# Patient Record
Sex: Female | Born: 1955 | Race: White | Hispanic: No | Marital: Married | Smoking: Never smoker
Health system: Southern US, Community
[De-identification: ages and names within clinical notes are randomized; demographics above are authoritative.]

## PROBLEM LIST (undated history)

## (undated) DIAGNOSIS — R112 Nausea with vomiting, unspecified: Secondary | ICD-10-CM

## (undated) DIAGNOSIS — T4145XA Adverse effect of unspecified anesthetic, initial encounter: Secondary | ICD-10-CM

## (undated) DIAGNOSIS — Z9889 Other specified postprocedural states: Secondary | ICD-10-CM

## (undated) DIAGNOSIS — T8859XA Other complications of anesthesia, initial encounter: Secondary | ICD-10-CM

## (undated) HISTORY — PX: HIP ARTHROPLASTY: SHX981

---

## 1998-12-02 ENCOUNTER — Other Ambulatory Visit: Admission: RE | Admit: 1998-12-02 | Discharge: 1998-12-02 | Payer: Self-pay | Admitting: Obstetrics and Gynecology

## 1999-02-11 ENCOUNTER — Encounter (INDEPENDENT_AMBULATORY_CARE_PROVIDER_SITE_OTHER): Payer: Self-pay | Admitting: Specialist

## 1999-02-11 ENCOUNTER — Ambulatory Visit (HOSPITAL_BASED_OUTPATIENT_CLINIC_OR_DEPARTMENT_OTHER): Admission: RE | Admit: 1999-02-11 | Discharge: 1999-02-11 | Payer: Self-pay | Admitting: Plastic Surgery

## 1999-12-05 ENCOUNTER — Other Ambulatory Visit: Admission: RE | Admit: 1999-12-05 | Discharge: 1999-12-05 | Payer: Self-pay | Admitting: Obstetrics and Gynecology

## 1999-12-15 ENCOUNTER — Encounter: Admission: RE | Admit: 1999-12-15 | Discharge: 1999-12-15 | Payer: Self-pay | Admitting: Obstetrics and Gynecology

## 1999-12-15 ENCOUNTER — Encounter: Payer: Self-pay | Admitting: Obstetrics and Gynecology

## 2000-12-07 ENCOUNTER — Other Ambulatory Visit: Admission: RE | Admit: 2000-12-07 | Discharge: 2000-12-07 | Payer: Self-pay | Admitting: Obstetrics and Gynecology

## 2000-12-17 ENCOUNTER — Encounter: Payer: Self-pay | Admitting: Obstetrics and Gynecology

## 2000-12-17 ENCOUNTER — Encounter: Admission: RE | Admit: 2000-12-17 | Discharge: 2000-12-17 | Payer: Self-pay | Admitting: Obstetrics and Gynecology

## 2001-12-22 ENCOUNTER — Encounter: Payer: Self-pay | Admitting: Obstetrics and Gynecology

## 2001-12-22 ENCOUNTER — Encounter: Admission: RE | Admit: 2001-12-22 | Discharge: 2001-12-22 | Payer: Self-pay | Admitting: Obstetrics and Gynecology

## 2003-01-09 ENCOUNTER — Encounter: Admission: RE | Admit: 2003-01-09 | Discharge: 2003-01-09 | Payer: Self-pay | Admitting: Obstetrics and Gynecology

## 2003-11-19 ENCOUNTER — Encounter: Admission: RE | Admit: 2003-11-19 | Discharge: 2003-11-19 | Payer: Self-pay | Admitting: Family Medicine

## 2004-01-11 ENCOUNTER — Encounter: Admission: RE | Admit: 2004-01-11 | Discharge: 2004-01-11 | Payer: Self-pay | Admitting: Family Medicine

## 2004-02-12 ENCOUNTER — Encounter: Admission: RE | Admit: 2004-02-12 | Discharge: 2004-02-12 | Payer: Self-pay | Admitting: Family Medicine

## 2004-08-13 ENCOUNTER — Ambulatory Visit (HOSPITAL_COMMUNITY): Admission: RE | Admit: 2004-08-13 | Discharge: 2004-08-13 | Payer: Self-pay | Admitting: Family Medicine

## 2005-01-22 ENCOUNTER — Encounter: Admission: RE | Admit: 2005-01-22 | Discharge: 2005-01-22 | Payer: Self-pay | Admitting: Family Medicine

## 2005-02-18 ENCOUNTER — Encounter: Admission: RE | Admit: 2005-02-18 | Discharge: 2005-02-18 | Payer: Self-pay | Admitting: Family Medicine

## 2005-02-25 ENCOUNTER — Encounter: Admission: RE | Admit: 2005-02-25 | Discharge: 2005-02-25 | Payer: Self-pay | Admitting: Family Medicine

## 2005-03-12 ENCOUNTER — Encounter: Admission: RE | Admit: 2005-03-12 | Discharge: 2005-03-12 | Payer: Self-pay | Admitting: Family Medicine

## 2005-04-21 ENCOUNTER — Encounter: Admission: RE | Admit: 2005-04-21 | Discharge: 2005-04-21 | Payer: Self-pay

## 2005-09-08 ENCOUNTER — Emergency Department (HOSPITAL_COMMUNITY): Admission: EM | Admit: 2005-09-08 | Discharge: 2005-09-08 | Payer: Self-pay | Admitting: Emergency Medicine

## 2005-09-09 ENCOUNTER — Ambulatory Visit (HOSPITAL_COMMUNITY): Admission: RE | Admit: 2005-09-09 | Discharge: 2005-09-09 | Payer: Self-pay | Admitting: Emergency Medicine

## 2005-09-09 ENCOUNTER — Encounter: Payer: Self-pay | Admitting: Vascular Surgery

## 2005-12-25 ENCOUNTER — Emergency Department (HOSPITAL_COMMUNITY): Admission: EM | Admit: 2005-12-25 | Discharge: 2005-12-25 | Payer: Self-pay | Admitting: Family Medicine

## 2006-01-25 ENCOUNTER — Encounter: Admission: RE | Admit: 2006-01-25 | Discharge: 2006-01-25 | Payer: Self-pay | Admitting: Family Medicine

## 2006-02-16 ENCOUNTER — Other Ambulatory Visit: Admission: RE | Admit: 2006-02-16 | Discharge: 2006-02-16 | Payer: Self-pay | Admitting: Family Medicine

## 2006-10-22 IMAGING — US US EXTREM LOW VENOUS*R*
1 series · 13 of 17 positions shown · non-contrast
Comparison: none

<!--  IDXRADR:ADDEND:BEGIN -->Addendum Begins<!--  IDXRADR:ADDEND:INNER_BEGIN -->CORRECTION ? 03/25/05:
 The patient was scheduled as a self-referral.

 <!--  IDXRADR:ADDEND:INNER_END -->Addendum Ends
<!--  IDXRADR:ADDEND:END -->Clinical Data:  The patient returns today two weeks following foam sclerotherapy of varicose veins bilaterally.  The patient is complaining of a fairly constant dull achy pain within her right calf, and foot, with intermittent sharp pains laterally in her calf and foot. 
 DUPLICATE COPY for exam association in [facility] -  No change from original report.
 RIGHT LOWER EXTREMITY VENOUS ULTRASOUND:
 OUTPATIENT CONSULTATION ? [id number]:
TECHNIQUE: Gray-scale sonography with compression, as well as color and duplex Doppler ultrasound, were performed to evaluate the deep venous system from the level of the common femoral vein through the popliteal and proximal calf veins.
 On physical exam, the varicose veins that were treated in the right calf appear thrombosed and are mildly tender.  There is no swelling or erythema.  No palpable areas of tenderness otherwise noted in the right calf.  
 The patient underwent right lower extremity venous ultrasound.  This shows no evidence of deep venous thrombosis with normal phasicity, augmentation, and compression.  The varicose veins that were injected in the anterior right calf are thrombosed and correspond to the palpable area of tenderness on the anterior lower calf. 
 I have instructed the patient to continue to wear her compression hose as well as to take Ibuprofen 800 mg three times a day for the next seven to ten days.  I will check back with the patient in one week to assess improvement.

[Series 1: unknown · 13 of 17 slices shown]
[im 1/17]
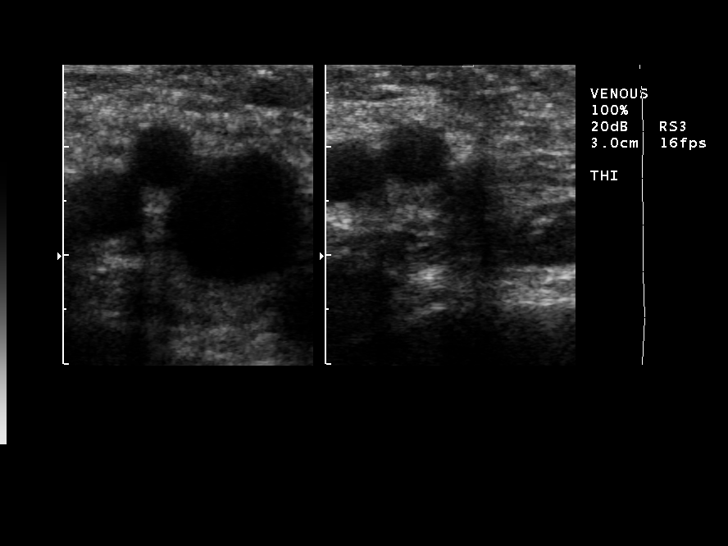
[im 2/17]
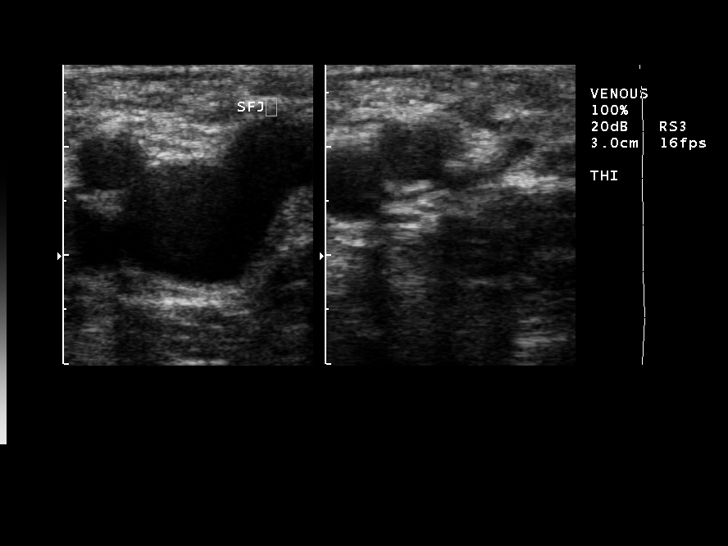
[im 4/17]
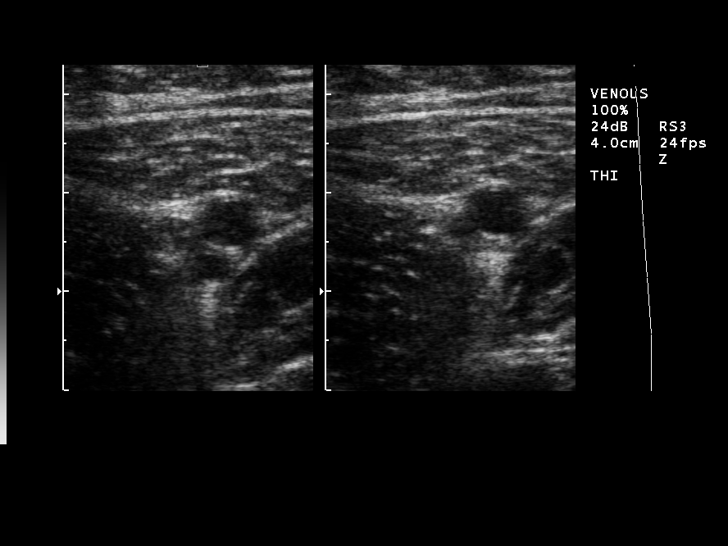
[im 5/17]
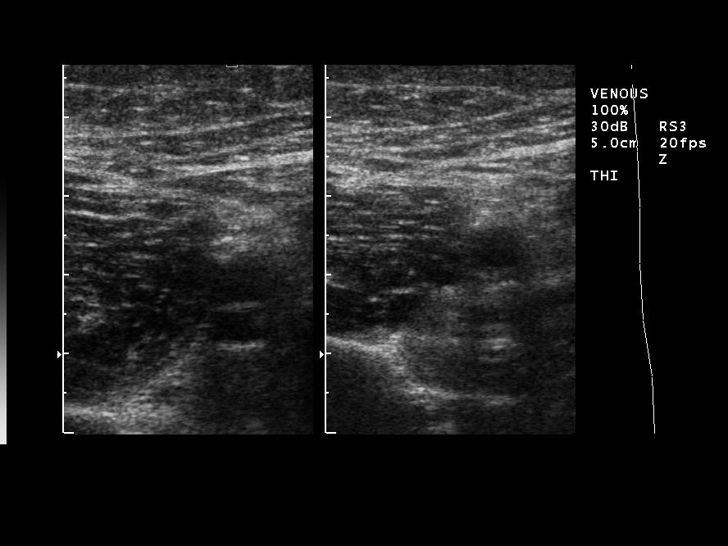
[im 6/17]
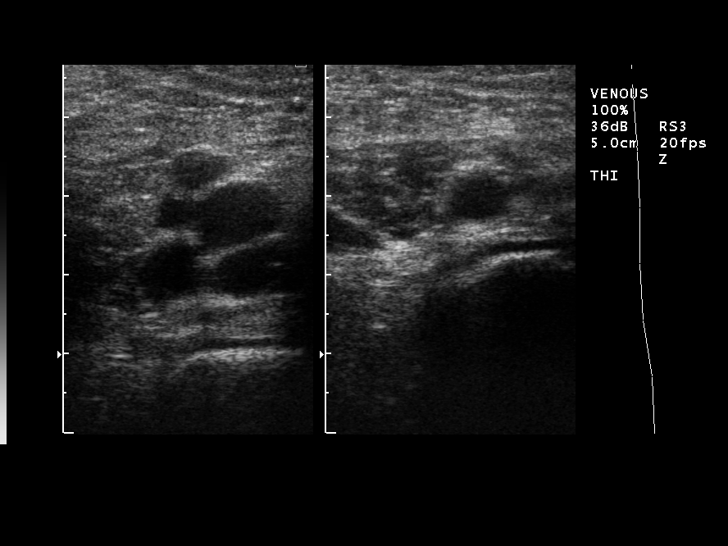
[im 8/17]
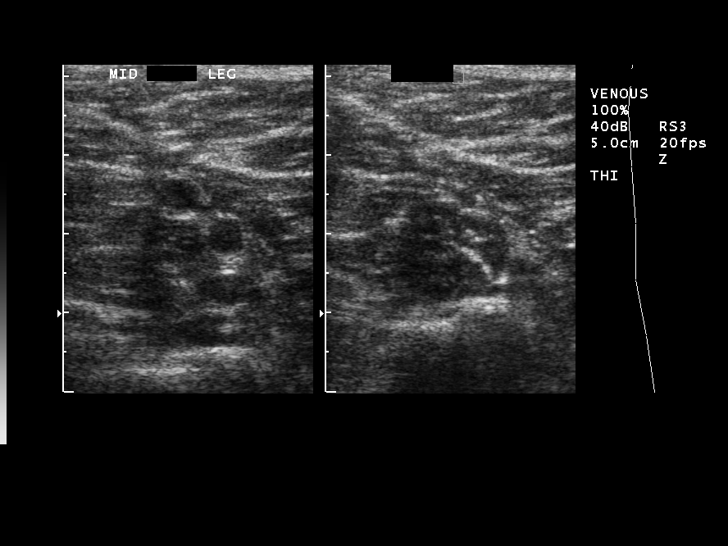
[im 9/17]
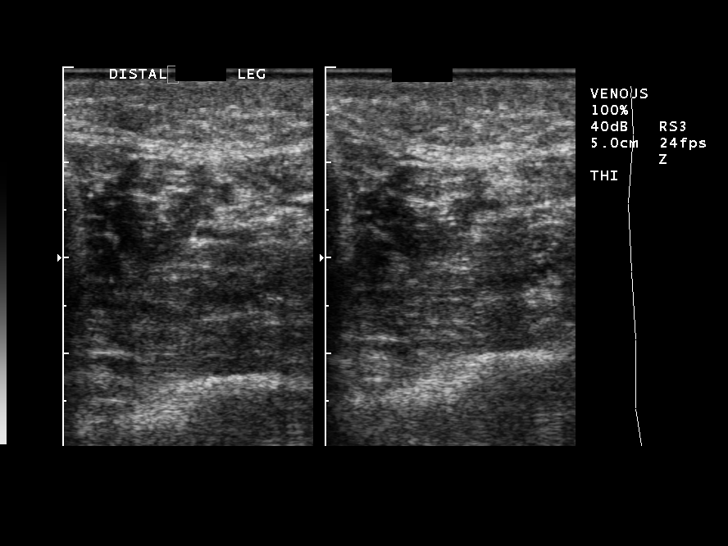
[im 10/17]
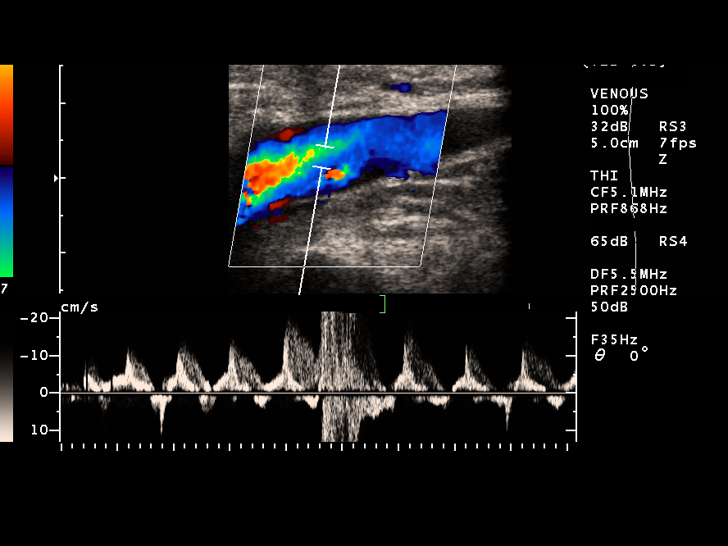
[im 12/17]
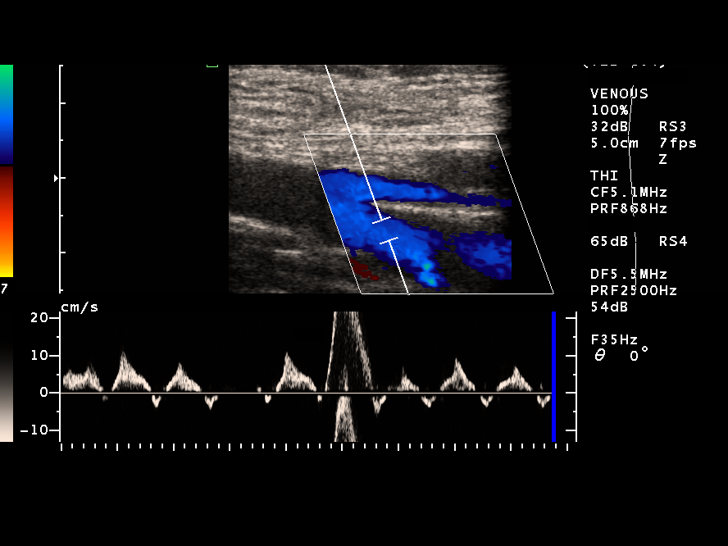
[im 13/17]
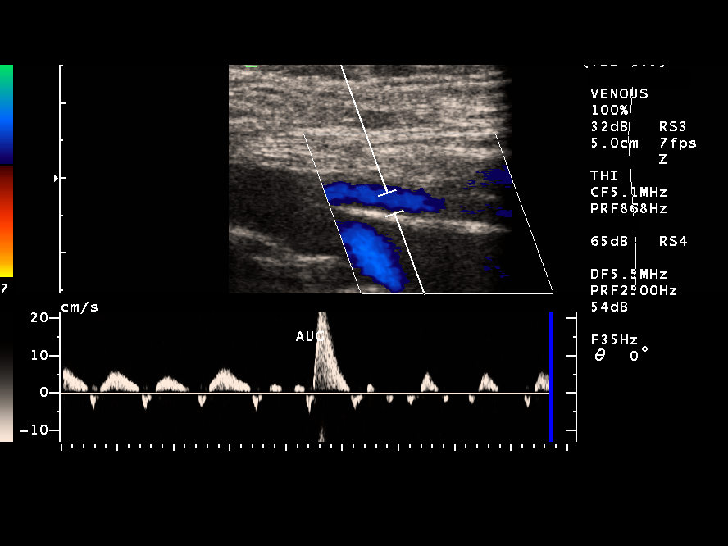
[im 14/17]
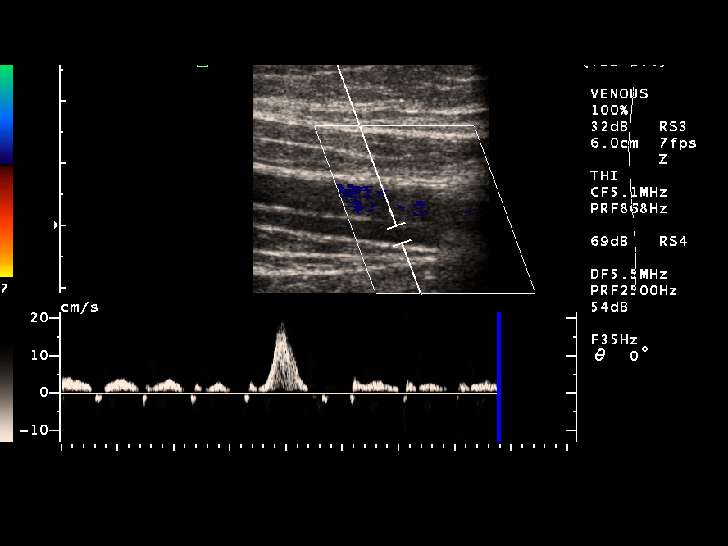
[im 16/17]
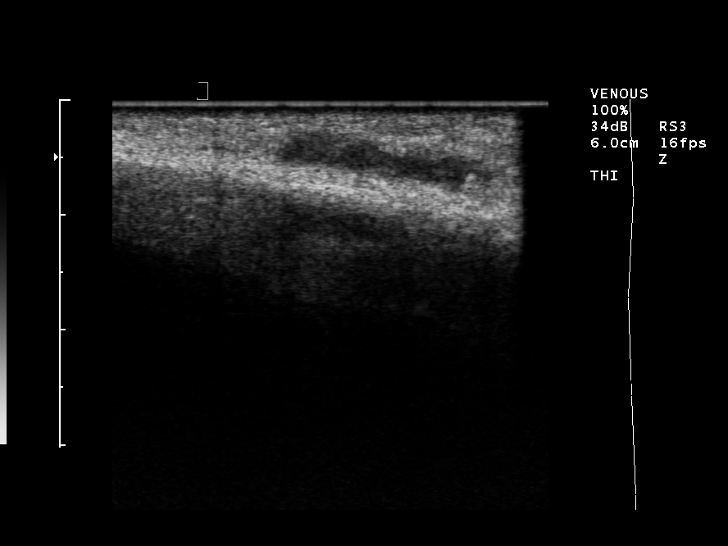
[im 17/17]
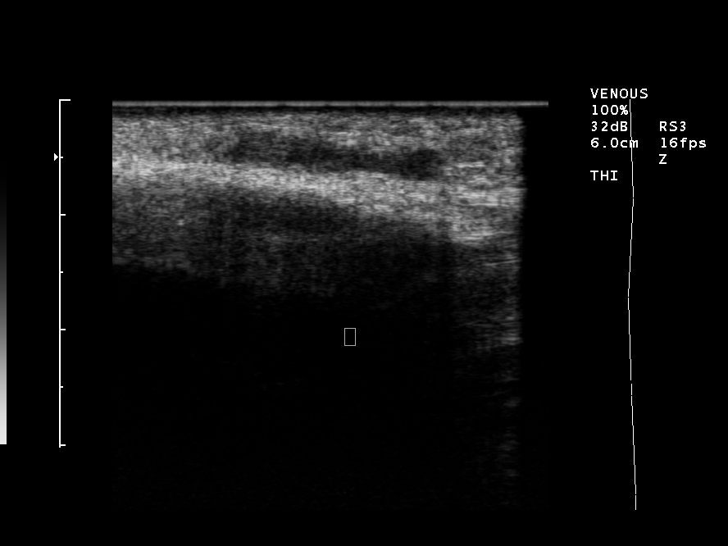

[13 of 17 positions shown; findings below may reference images not displayed]

IMPRESSION: 1.  The patient has dull achy pain with intermittent sharp pain in her right calf two weeks following foam sclerotherapy of anterior right calf varicosities.  Given that there is no evidence of deep venous thrombosis and the varicose veins are thrombosed as expected, I have instructed the patient to continue to wear her compression hose as much as possible as well as take Ibuprofen 800 mg three times a day for seven to ten days.

## 2007-02-07 ENCOUNTER — Encounter: Admission: RE | Admit: 2007-02-07 | Discharge: 2007-02-07 | Payer: Self-pay | Admitting: Family Medicine

## 2007-09-16 ENCOUNTER — Ambulatory Visit (HOSPITAL_COMMUNITY): Admission: RE | Admit: 2007-09-16 | Discharge: 2007-09-16 | Payer: Self-pay | Admitting: Family Medicine

## 2007-09-21 ENCOUNTER — Ambulatory Visit (HOSPITAL_COMMUNITY): Admission: RE | Admit: 2007-09-21 | Discharge: 2007-09-21 | Payer: Self-pay | Admitting: Family Medicine

## 2008-02-20 ENCOUNTER — Encounter: Admission: RE | Admit: 2008-02-20 | Discharge: 2008-02-20 | Payer: Self-pay | Admitting: Family Medicine

## 2008-08-13 ENCOUNTER — Other Ambulatory Visit: Admission: RE | Admit: 2008-08-13 | Discharge: 2008-08-13 | Payer: Self-pay | Admitting: Family Medicine

## 2009-01-14 ENCOUNTER — Ambulatory Visit: Payer: Self-pay | Admitting: Psychology

## 2009-02-20 ENCOUNTER — Encounter: Admission: RE | Admit: 2009-02-20 | Discharge: 2009-02-20 | Payer: Self-pay | Admitting: Family Medicine

## 2009-03-21 ENCOUNTER — Encounter: Admission: RE | Admit: 2009-03-21 | Discharge: 2009-03-21 | Payer: Self-pay | Admitting: Family Medicine

## 2009-07-16 ENCOUNTER — Ambulatory Visit: Payer: Self-pay | Admitting: Psychology

## 2009-09-13 ENCOUNTER — Encounter: Admission: RE | Admit: 2009-09-13 | Discharge: 2009-09-13 | Payer: Self-pay | Admitting: Orthopedic Surgery

## 2009-12-12 ENCOUNTER — Ambulatory Visit: Payer: Self-pay | Admitting: Psychology

## 2010-01-06 ENCOUNTER — Ambulatory Visit: Payer: Self-pay | Admitting: Sports Medicine

## 2010-01-06 DIAGNOSIS — M169 Osteoarthritis of hip, unspecified: Secondary | ICD-10-CM | POA: Insufficient documentation

## 2010-01-06 DIAGNOSIS — M2170 Unequal limb length (acquired), unspecified site: Secondary | ICD-10-CM | POA: Insufficient documentation

## 2010-01-06 DIAGNOSIS — M25559 Pain in unspecified hip: Secondary | ICD-10-CM | POA: Insufficient documentation

## 2010-02-07 ENCOUNTER — Ambulatory Visit
Admission: RE | Admit: 2010-02-07 | Discharge: 2010-02-07 | Payer: Self-pay | Source: Home / Self Care | Attending: Psychology | Admitting: Psychology

## 2010-02-23 ENCOUNTER — Encounter: Payer: Self-pay | Admitting: Family Medicine

## 2010-02-24 ENCOUNTER — Encounter
Admission: RE | Admit: 2010-02-24 | Discharge: 2010-02-24 | Payer: Self-pay | Source: Home / Self Care | Attending: Family Medicine | Admitting: Family Medicine

## 2010-03-04 NOTE — Assessment & Plan Note (Signed)
Summary: NP,HIP PAIN,MC   Vital Signs:  Patient profile:   55 year old female Height:      64 inches Weight:      124 pounds BMI:     21.36 BP sitting:   122 / 80  Vitals Entered By: Lillia Pauls CMA (January 06, 2010 10:33 AM)   History of Present Illness: pt is a new pt today with cc of r hip pain x aug but the pain originally started 3 years ago after a workout class. describes the pain as a "throbbing" type pain that radiates down to her right knee at times and sometimes into her quad. still does activities like yoga, body pump, and walking but the pain hurts worse during sleep. Going up stairs bothers more than with just walking.  pt did have an injection on aug 12th at [facility]. has seen dr Despina Hick previously and had films done there earlier this year  Limitation of yoga poses of rt hip flexion and rotation. Does not have hx of serious r hip injury or congenital hip problems. Takes 2 ibuprofen for pain- not significantly helpful.  Has massage once per month- does not hurt area, but does not make noticeable difference.    note workup per Dr Despina Hick did show significant DJD of RT hip This is confirmed best on plain XRay MRI also shows some sclerosis and osteopenic change in fem head    Physical Exam  General:  Well-developed,well-nourished,in no acute distress; alert,appropriate and cooperative throughout examination Msk:  Left hip 40 degrees interal and external rotation Rt hip 15 degrees internal and 30 degrees external rotation in seated position Good hip flexion strength bilaterally Straight leg raise good on rt and lt Pearlean Brownie left very good Pearlean Brownie rt very limited, 1/2 or normal. Full hip flexion lt 110 degrees hip flexion rt Rt leg 1.25 cm shorter than lt leg Lt hip abduction strength good  Rt hip adduction strength good Rt hip abduction strength weak Hip rotation strong on rt   Impression & Recommendations:  Problem # 1:  HIP PAIN, RIGHT, CHRONIC  (ICD-719.45)  Her updated medication list for this problem includes:    Tramadol Hcl 50 Mg Tabs (Tramadol hcl) .Marland Kitchen... 1 by mouth qid prn   I want her to try taking at least 2 tramsdol daily to see if we can relieve some pain  can use as needed meds otherwise  Problem # 2:  OSTEOARTHRITIS, HIP, RIGHT (ICD-715.95)  Her updated medication list for this problem includes:    Tramadol Hcl 50 Mg Tabs (Tramadol hcl) .Marland Kitchen... 1 by mouth qid prn   try low dose amitriptyline at night only see if this will allow better sleep/ less night time spasm  work easy hip rehab for rotation and abduction  Problem # 3:  UNEQUAL LEG LENGTH (ICD-736.81) lift given this improves gait so that she has less trendelenburg to Rt  try in all shoes  reck 6 wks  Complete Medication List: 1)  Tramadol Hcl 50 Mg Tabs (Tramadol hcl) .Marland Kitchen.. 1 by mouth qid prn 2)  Amitriptyline Hcl 10 Mg Tabs (Amitriptyline hcl) .Marland Kitchen.. 1 or 2 by mouth at bedtime Prescriptions: AMITRIPTYLINE HCL 10 MG TABS (AMITRIPTYLINE HCL) 1 or 2 by mouth at bedtime  #60 x 5   Entered by:   Rochele Pages RN   Authorized by:   Enid Baas MD   Signed by:   Rochele Pages RN on 01/06/2010   Method used:   Electronically to  [facility]* (retail)       [address]       [address] Shipping/mailing       [place], [place]  [address]       Ph: [phone]       Fax: [phone]   RxID:   [id number] TRAMADOL HCL 50 MG TABS (TRAMADOL HCL) 1 by mouth qid prn  #100 x 5   Entered by:   Rochele Pages RN   Authorized by:   Enid Baas MD   Signed by:   Rochele Pages RN on 01/06/2010   Method used:   Electronically to        [facility]* (retail)       [address]       [address] Shipping/mail[address]       [place], [place]  [address]       Ph: [phone]       Fax: [phone]   RxID:   [id number]    Orders Added: 1)  New Patient Level III [06301]

## 2010-07-03 ENCOUNTER — Encounter (HOSPITAL_COMMUNITY): Payer: 59

## 2010-07-03 ENCOUNTER — Other Ambulatory Visit: Payer: Self-pay | Admitting: Orthopedic Surgery

## 2010-07-03 ENCOUNTER — Other Ambulatory Visit (HOSPITAL_COMMUNITY): Payer: Self-pay | Admitting: Orthopedic Surgery

## 2010-07-03 ENCOUNTER — Ambulatory Visit (HOSPITAL_COMMUNITY)
Admission: RE | Admit: 2010-07-03 | Discharge: 2010-07-03 | Disposition: A | Discharge status: Home / Self Care | Payer: 59 | Source: Ambulatory Visit | Attending: Orthopedic Surgery | Admitting: Orthopedic Surgery

## 2010-07-03 DIAGNOSIS — M169 Osteoarthritis of hip, unspecified: Secondary | ICD-10-CM

## 2010-07-03 DIAGNOSIS — Z01812 Encounter for preprocedural laboratory examination: Secondary | ICD-10-CM | POA: Insufficient documentation

## 2010-07-03 DIAGNOSIS — Z01811 Encounter for preprocedural respiratory examination: Secondary | ICD-10-CM | POA: Insufficient documentation

## 2010-07-03 LAB — COMPREHENSIVE METABOLIC PANEL
ALT: 9 U/L (ref 0–35)
AST: 20 U/L (ref 0–37)
Albumin: 4.1 g/dL (ref 3.5–5.2)
Alkaline Phosphatase: 73 U/L (ref 39–117)
BUN: 14 mg/dL (ref 6–23)
CO2: 28 mEq/L (ref 19–32)
Calcium: 8.7 mg/dL (ref 8.4–10.5)
Chloride: 102 mEq/L (ref 96–112)
Creatinine, Ser: 0.66 mg/dL (ref 0.4–1.2)
GFR calc Af Amer: >60 mL/min (ref >60)
GFR calc non Af Amer: >60 mL/min (ref >60)
Glucose, Bld: 83 mg/dL (ref 70–99)
Potassium: 4.1 mEq/L (ref 3.5–5.1)
Sodium: 137 mEq/L (ref 135–145)
Total Bilirubin: 0.3 mg/dL (ref 0.3–1.2)
Total Protein: 7.3 g/dL (ref 6.0–8.3)

## 2010-07-03 LAB — SURGICAL PCR SCREEN
MRSA, PCR: NEGATIVE
Staphylococcus aureus: POSITIVE — AB

## 2010-07-03 LAB — URINALYSIS, ROUTINE W REFLEX MICROSCOPIC
Bilirubin Urine: NEGATIVE
Glucose, UA: NEGATIVE mg/dL
Hgb urine dipstick: NEGATIVE
Ketones, ur: NEGATIVE mg/dL
Nitrite: NEGATIVE
Protein, ur: NEGATIVE mg/dL
Specific Gravity, Urine: 1.026 (ref 1.005–1.030)
Urobilinogen, UA: 0.2 mg/dL (ref 0.0–1.0)
pH: 5 (ref 5.0–8.0)

## 2010-07-03 LAB — CBC
HCT: 40.9 % (ref 36.0–46.0)
Hemoglobin: 12.9 g/dL (ref 12.0–15.0)
MCH: 26.8 pg (ref 26.0–34.0)
MCHC: 31.5 g/dL (ref 30.0–36.0)
MCV: 84.9 fL (ref 78.0–100.0)
Platelets: 225 10*3/uL (ref 150–400)
RBC: 4.82 MIL/uL (ref 3.87–5.11)
RDW: 16.6 % — ABNORMAL HIGH (ref 11.5–15.5)
WBC: 6.4 10*3/uL (ref 4.0–10.5)

## 2010-07-03 LAB — URINE MICROSCOPIC-ADD ON

## 2010-07-03 LAB — PROTIME-INR
INR: 1 (ref 0.00–1.49)
Prothrombin Time: 13.4 seconds (ref 11.6–15.2)

## 2010-07-03 LAB — APTT: aPTT: 31 seconds (ref 24–37)

## 2010-07-09 ENCOUNTER — Inpatient Hospital Stay (HOSPITAL_COMMUNITY): Payer: 59

## 2010-07-09 ENCOUNTER — Inpatient Hospital Stay (HOSPITAL_COMMUNITY)
Admission: RE | Admit: 2010-07-09 | Discharge: 2010-07-12 | DRG: 470 | Disposition: A | Discharge status: Home Health | Payer: 59 | Source: Ambulatory Visit | Attending: Orthopedic Surgery | Admitting: Orthopedic Surgery

## 2010-07-09 DIAGNOSIS — Q6589 Other specified congenital deformities of hip: Secondary | ICD-10-CM

## 2010-07-09 DIAGNOSIS — M1610 Unilateral primary osteoarthritis, unspecified hip: Principal | ICD-10-CM | POA: Diagnosis present

## 2010-07-09 DIAGNOSIS — E876 Hypokalemia: Secondary | ICD-10-CM | POA: Diagnosis not present

## 2010-07-09 DIAGNOSIS — M169 Osteoarthritis of hip, unspecified: Principal | ICD-10-CM | POA: Diagnosis present

## 2010-07-09 DIAGNOSIS — D62 Acute posthemorrhagic anemia: Secondary | ICD-10-CM | POA: Diagnosis not present

## 2010-07-09 LAB — TYPE AND SCREEN
ABO/RH(D): O POS
Antibody Screen: NEGATIVE

## 2010-07-09 LAB — ABO/RH: ABO/RH(D): O POS

## 2010-07-10 LAB — CBC
HCT: 28.6 % — ABNORMAL LOW (ref 36.0–46.0)
Hemoglobin: 9.1 g/dL — ABNORMAL LOW (ref 12.0–15.0)
MCH: 27.4 pg (ref 26.0–34.0)
MCHC: 31.8 g/dL (ref 30.0–36.0)
MCV: 86.1 fL (ref 78.0–100.0)
Platelets: 167 10*3/uL (ref 150–400)
RBC: 3.32 MIL/uL — ABNORMAL LOW (ref 3.87–5.11)
RDW: 16.8 % — ABNORMAL HIGH (ref 11.5–15.5)
WBC: 7.7 10*3/uL (ref 4.0–10.5)

## 2010-07-10 LAB — BASIC METABOLIC PANEL
BUN: 7 mg/dL (ref 6–23)
CO2: 27 mEq/L (ref 19–32)
Calcium: 8.3 mg/dL — ABNORMAL LOW (ref 8.4–10.5)
Chloride: 105 mEq/L (ref 96–112)
Creatinine, Ser: 0.54 mg/dL (ref 0.4–1.2)
GFR calc Af Amer: >60 mL/min (ref >60)
GFR calc non Af Amer: >60 mL/min (ref >60)
Glucose, Bld: 141 mg/dL — ABNORMAL HIGH (ref 70–99)
Potassium: 3.8 mEq/L (ref 3.5–5.1)
Sodium: 137 mEq/L (ref 135–145)

## 2010-07-11 LAB — CBC
HCT: 26.6 % — ABNORMAL LOW (ref 36.0–46.0)
Hemoglobin: 8.5 g/dL — ABNORMAL LOW (ref 12.0–15.0)
MCH: 27.4 pg (ref 26.0–34.0)
MCHC: 32 g/dL (ref 30.0–36.0)
MCV: 85.8 fL (ref 78.0–100.0)
Platelets: 129 10*3/uL — ABNORMAL LOW (ref 150–400)
RBC: 3.1 MIL/uL — ABNORMAL LOW (ref 3.87–5.11)
RDW: 17.2 % — ABNORMAL HIGH (ref 11.5–15.5)
WBC: 7 10*3/uL (ref 4.0–10.5)

## 2010-07-11 LAB — BASIC METABOLIC PANEL
BUN: 3 mg/dL — ABNORMAL LOW (ref 6–23)
CO2: 30 mEq/L (ref 19–32)
Calcium: 7.8 mg/dL — ABNORMAL LOW (ref 8.4–10.5)
Chloride: 104 mEq/L (ref 96–112)
Creatinine, Ser: 0.56 mg/dL (ref 0.4–1.2)
GFR calc Af Amer: >60 mL/min (ref >60)
GFR calc non Af Amer: >60 mL/min (ref >60)
Glucose, Bld: 110 mg/dL — ABNORMAL HIGH (ref 70–99)
Potassium: 3.3 mEq/L — ABNORMAL LOW (ref 3.5–5.1)
Sodium: 138 mEq/L (ref 135–145)

## 2010-07-11 LAB — HEMOGLOBIN AND HEMATOCRIT, BLOOD
HCT: 24.6 % — ABNORMAL LOW (ref 36.0–46.0)
Hemoglobin: 7.9 g/dL — ABNORMAL LOW (ref 12.0–15.0)

## 2010-07-12 LAB — BASIC METABOLIC PANEL
BUN: 5 mg/dL — ABNORMAL LOW (ref 6–23)
CO2: 30 mEq/L (ref 19–32)
Calcium: 7.9 mg/dL — ABNORMAL LOW (ref 8.4–10.5)
Chloride: 106 mEq/L (ref 96–112)
Creatinine, Ser: 0.5 mg/dL (ref 0.4–1.2)
GFR calc Af Amer: >60 mL/min (ref >60)
GFR calc non Af Amer: >60 mL/min (ref >60)
Glucose, Bld: 104 mg/dL — ABNORMAL HIGH (ref 70–99)
Potassium: 4.2 mEq/L (ref 3.5–5.1)
Sodium: 138 mEq/L (ref 135–145)

## 2010-07-12 LAB — CBC
HCT: 24.6 % — ABNORMAL LOW (ref 36.0–46.0)
Hemoglobin: 7.7 g/dL — ABNORMAL LOW (ref 12.0–15.0)
MCH: 27.2 pg (ref 26.0–34.0)
MCHC: 31.3 g/dL (ref 30.0–36.0)
MCV: 86.9 fL (ref 78.0–100.0)
Platelets: 126 10*3/uL — ABNORMAL LOW (ref 150–400)
RBC: 2.83 MIL/uL — ABNORMAL LOW (ref 3.87–5.11)
RDW: 17.8 % — ABNORMAL HIGH (ref 11.5–15.5)
WBC: 6.5 10*3/uL (ref 4.0–10.5)

## 2010-07-16 NOTE — Op Note (Signed)
NAMEBERNARDA, [name]               ACCOUNT NO.:  [id number]  MEDICAL RECORD NO.:  [id number]  LOCATION:  1619                         FACILITY:  [facility]  PHYSICIAN:  Ollen Gross, M.D.    DATE OF BIRTH:  1955/11/03  DATE OF PROCEDURE:  07/09/2010 DATE OF DISCHARGE:                              OPERATIVE REPORT   PREOPERATIVE DIAGNOSIS:  Osteoarthritis, right hip.  POSTOPERATIVE DIAGNOSIS:  Osteoarthritis, right hip.  PROCEDURE:  Right total hip arthroplasty.  SURGEON:  Ollen Gross, MD  ASSISTANT:  Alexzandrew L. Perkins, PA-C  ANESTHESIA:  Spinal.  ESTIMATED BLOOD LOSS:  450.  DRAIN:  Hemovac x1.  COMPLICATIONS:  None.  CONDITION:  Stable to Recovery.  BRIEF CLINICAL NOTE:  [name] is a 55 year old female who has developed advanced end-stage arthritis of the right hip with progressively worsening pain and dysfunction.  She has an element of dysplasia also. She has had progressively worsening pain and presents now for right total hip arthroplasty.  PROCEDURE IN DETAIL:  After successful administration of spinal anesthetic, the patient was placed in the left lateral decubitus position with the right side up and held with a hip positioner.  Right lower extremity was isolated from her perineum with plastic drapes and prepped and draped in a usual sterile fashion.  Short posterolateral incision was made with a #10 blade through subcutaneous tissue to the fascia lata which was incised in line with the skin incision.  The sciatic nerve was palpated and protected and the short rotators and capsule were isolated off the femur.  Hip was then dislocated.  The center of femoral head was marked and a trial prosthesis was placed such that the center of the trial head corresponds to the center of her native femoral head.  Osteotomy line was marked on the femoral neck and osteotomy made with an oscillating saw.  The femoral head was removed. Femoral retractors were placed to  gain access to the proximal femur.  The canal finder was used to gain access to the femoral canal.  The canal was thoroughly irrigated with saline to remove fatty contents.  We then axial reamed up to 13.5 mm and performed proximal reaming to 18D. The sleeve was machined to a large.  An 18D large trial sleeve was placed.  The femur was then retracted anteriorly to gain acetabular exposure. Acetabular retractors were placed and labrum and osteophytes removed. Acetabular reaming begins at 45 mm coursing increments up to 2-51 mm, then a 52 mm Pinnacle acetabular shell was placed in anatomic position and transfixed with 2 dome screws with excellent purchase.  A 32 mm neutral +4 Marathon liner was placed.  We then placed the trial femur which was an 18 x 13 with a 36 +8 neck matching native anteversion.  The 32 +0 trial head was placed and the hip was reduced with excellent stability.  There was full extension, full external rotation, 70 degrees flexion, 40 degrees adduction, 90 degrees internal rotation, and 90 degrees of flexion with 70 degrees of internal rotation.  By placing the right leg on top of the left the leg lengths were equal.  Hip was then dislocated and trials were removed.  The permanent 18D large sleeve was placed with 18 x 13 stem and the 36 +8 neck matching native anteversion. The 32 +0 ceramic head was placed and the hip was reduced with the same stability parameters.  The wound was copiously irrigated with saline solution and then the short rotators and capsule reattached to the femur through drill holes with Ethibond suture.  Fascia lata was then closed over Hemovac drain with interrupted #1 Vicryl, subcutaneous closed with #1-0 and #2-0 Vicryl, and subcuticular with running 4-0 Monocryl.  The catheter for Marcaine pain pump was placed and the pump was initiated. Incision was cleaned and dried and Steri-Strips and a bulky sterile dressing were applied.  She was then  placed into a knee immobilizer, awakened, and transported to Recovery in stable condition.     Ollen Gross, M.D.     FA/MEDQ  D:  07/09/2010  T:  07/10/2010  Job:  [id number]  Electronically Signed by Ollen Gross M.D. on 07/16/2010 03:24:36 PM

## 2010-07-28 NOTE — H&P (Signed)
NAME:  Melissa [name], Melissa [name]                ACCOUNT NO.:  [id number]  MEDICAL RECORD NO.:  LOCATION:                                 FACILITY:  PHYSICIAN:  Ollen Gross, M.D.         DATE OF BIRTH:  DATE OF ADMISSION:  07/09/2010 DATE OF DISCHARGE:                             HISTORY & PHYSICAL   CHIEF COMPLAINT:  Right hip pain.  HISTORY OF PRESENT ILLNESS:  The patient is a 55 year old female who has been seen by Dr. Lequita Halt for ongoing hip pain.  She has known progressive arthritis, been deeply brought out with exercising.  Pain developed in the lateral hip and into the groin.  Activity such as golf another exercise working out will prove to be painful.  She had some x- rays earlier couple of years ago which showed some early degenerative changes, but has been progressive in nature.  She is seen earlier this year by Dr. Lequita Halt and found to have progressive arthritis to the point where now it is bone-on-bone with large osteophytes.  It was felt she would benefit from undergoing surgical intervention.  Risks and benefits have been discussed and she elected to proceed with surgery.  ALLERGIES/INTOLERANCES: 1. PENICILLIN causes severe rash. 2. KEFLEX causes nausea. 3. FLAGYL causes nausea. 4. She did have an episode of nausea while taking PERCOCET, but this     was following a wisdom teeth extraction procedure,  therefore she     does not know if she is actually intolerant to this medication.  CURRENT MEDICATIONS: 1. Occasional calcium plus D. 2. Celexa 10 mg daily. 3. Tramadol 50 mg p.o. q.6 h. p.r.n. pain. 4. Amitriptyline 10 mg at night (but she does not require this every     night).  PAST MEDICAL HISTORY:  Postmenopausal.  PAST SURGICAL HISTORY:  No major surgical procedures.  FAMILY HISTORY:  Father with COPD, emphysema.  Mother with history of pulmonary embolus.  SOCIAL HISTORY:  Married, nonsmoker.  Two children.  She lives in a two- storey home.  She does have a  living will, healthcare power of attorney.  REVIEW OF SYSTEMS:  GENERAL:  No fever, chills, or night sweats. NEUROLOGIC:  No seizures, syncope, or paralysis.  RESPIRATORY:  She has some occasional sinus drainage which may be attributed to seasonal allergies.  No productive cough or hemoptysis.  CARDIOVASCULAR:  No chest pain, angina, orthopnea.  GI:  No nausea, vomiting, diarrhea, constipation.  No blood or mucus in stool.  GU:  No dysuria, hematuria, or discharge.  MUSCULOSKELETAL:  Hip pain.  PHYSICAL EXAMINATION:  VITAL SIGNS:  Pulse 64, respirations 12, blood pressure 138/78. GENERAL:  A 55 year old white female well nourished, well developed, petite, thin frame, no acute distress.  She is alert, oriented, and cooperative, very pleasant, excellent historian. HEENT:  Normocephalic, atraumatic.  Pupils are round and reactive.  EOMs intact. NECK:  Supple.  No carotid bruits are appreciated. CHEST:  Clear anterior posterior chest walls.  No rhonchi, rales, or wheezing. HEART:  Regular rhythm.  No murmurs.  S1 and S2 is noted. ABDOMEN:  Soft, flat, bowel sounds present. RECTAL/BREAST/GENITALIA:  Not done, not pertinent to present  illness. EXTREMITIES:  Right hip flexion 95, internal rotation 0, external rotation 20, abduction about 20.  X-rays taken in the office shows essentially bone-on-bone of the right hip with large osteophyte formation.  IMPRESSION:  Osteoarthritis right hip.  PLAN:  The patient will be admitted to [facility] to undergo right total hip replacement arthroplasty.  Surgery will be performed by Dr. Ollen Gross.  Risks and benefits of the procedure have been discussed in detail preoperatively.  Her medical physician, Dr. Merri Brunette, will be consulted if needed for any medical assistance with the patient during the hospital course.  We will have all the preop diagnostic studies including the lab work and if needed EKG and chest x- ray, they will be  sent over to Dr. Michaelle Copas office preoperatively.     Alexzandrew L. Julien Girt, P.A.C.   ______________________________ Ollen Gross, M.D.    ALP/MEDQ  D:  06/12/2010  T:  06/13/2010  Job:  [id number]  cc:   Dario Guardian, M.D. Fax: [phone]  Electronically Signed by Patrica Duel P.A.C. on 07/23/2010 07:13:02 AM Electronically Signed by Ollen Gross M.D. on 07/28/2010 05:01:39 PM

## 2010-08-11 ENCOUNTER — Ambulatory Visit: Payer: 59 | Attending: Orthopedic Surgery

## 2010-08-11 DIAGNOSIS — IMO0001 Reserved for inherently not codable concepts without codable children: Secondary | ICD-10-CM | POA: Insufficient documentation

## 2010-08-11 DIAGNOSIS — R269 Unspecified abnormalities of gait and mobility: Secondary | ICD-10-CM | POA: Insufficient documentation

## 2010-08-11 DIAGNOSIS — M6281 Muscle weakness (generalized): Secondary | ICD-10-CM | POA: Insufficient documentation

## 2010-08-11 DIAGNOSIS — Z96649 Presence of unspecified artificial hip joint: Secondary | ICD-10-CM | POA: Insufficient documentation

## 2010-08-14 ENCOUNTER — Ambulatory Visit: Payer: 59

## 2010-08-18 ENCOUNTER — Ambulatory Visit: Payer: 59 | Admitting: Physical Therapy

## 2010-08-19 ENCOUNTER — Ambulatory Visit: Payer: 59

## 2010-08-22 NOTE — Discharge Summary (Signed)
Melissa [name], Melissa [name]               ACCOUNT NO.:  [id number]  MEDICAL RECORD NO.:  [id number]  LOCATION:  1619                         FACILITY:  [facility]  PHYSICIAN:  Alexzandrew L. Perkins, P.A.C.DATE OF BIRTH:  1955-03-03  DATE OF ADMISSION:  07/09/2010 DATE OF DISCHARGE:  07/12/2010                              DISCHARGE SUMMARY   ADMITTING DIAGNOSES: 1. Osteoarthritis, right hip. 2. Postmenopausal.  DISCHARGE DIAGNOSES: 1. Osteoarthritis, right hip, status post right total hip replacement     arthroplasty. 2. Postop acute blood loss anemia. 3. Postop hypokalemia, improved. 4. Postmenopausal.  PROCEDURES:  July 09, 2010, right total hip.  SURGEON:  Ollen Gross, M.D.  ASSISTANT:  Alexzandrew L. Perkins, P.A.C.  ANESTHESIA:  Spinal anesthesia.  CONSULTS:  None.  BRIEF HISTORY:  The patient is a 55 year old female who developed end- stage arthritis of the right hip with progressive worsening pain and dysfunction, element of dysplasia, progressive pain, now presents for total hip arthroplasty.  LABORATORY DATA:  Preop CBC on admission not found in the chart, not scanned in but the hemoglobin dropped down to 9.1, then 8.5, got as low as 7.7 with a hematocrit of 24.6.  Chem panel on admission is not scanned in the chart.  Serial BMETs were followed, though.  Potassium dropped from 3.8 to 3.3, back up to 4.2.  Electrolytes remained within normal limits.  Blood group type O positive.  X-RAYS:  Portable pelvis and right hip film showed well-seated femoral and acetabular components of the total hip.  HOSPITAL COURSE:  The patient was admitted to the [facility], taken to OR, underwent above-stated procedure without complication.  The patient tolerated the procedure well, later transferred to recovery room on the orthopedic floor.  Doing pretty well on the morning of day 1, although sore following surgery.  Started getting up of protective weightbearing 25% to  50%.  Had good urine output.  Hemoglobin was 12.9 to start but was down to 9.1.  Her pressure was a little soft and running low, although she was a petite-frame female and expected to doing a little lower than normal.  We did follow it and supplemented with fluids to maintain her pressure.  She was asymptomatic at this time.  Started getting up out of bed with therapy.  She had a little bit of nausea and vomiting later that afternoon but by day 2, she was doing better.  Pain was under good control.  Blood pressure was stable. Hemoglobin was 8.5.  We did start her on some iron supplementation. Potassium was low at 3.3, so we gave her some potassium supplements. She responded well to this and by day 3, potassium was back up.  She is asymptomatic with a low hemoglobin though, so we are just going to monitor it and she was meeting her goals and discharged home.  DISCHARGE PLAN: 1. The patient was discharged home on July 12, 2010. 2. Discharge diagnoses, please see above. 3. Discharge medications, Vicodin, Nu-Iron, Robaxin, Xarelto,     tramadol.  Continue amitriptyline and citalopram.  DIET:  As tolerated.  ACTIVITY:  Partial weightbearing 25% to 50% right lower extremity.  Hip precautions, total hip protocol.  FOLLOWUP:  2 weeks.  DISPOSITION:  Home.  CONDITION ON DISCHARGE:  Improved.     Alexzandrew L. Perkins, P.A.C.     ALP/MEDQ  D:  08/21/2010  T:  08/21/2010  Job:  [id number]  cc:   Dario Guardian, M.D. Fax: [phone]  Electronically Signed by Patrica Duel P.A.C. on 08/21/2010 12:14:42 PM Electronically Signed by Ollen Gross M.D. on 08/22/2010 06:04:21 PM

## 2010-08-25 ENCOUNTER — Ambulatory Visit: Payer: 59 | Admitting: Physical Therapy

## 2010-08-27 ENCOUNTER — Ambulatory Visit: Payer: 59 | Admitting: Physical Therapy

## 2010-09-03 ENCOUNTER — Ambulatory Visit: Payer: 59 | Attending: Orthopedic Surgery | Admitting: Physical Therapy

## 2010-09-03 DIAGNOSIS — Z96649 Presence of unspecified artificial hip joint: Secondary | ICD-10-CM | POA: Insufficient documentation

## 2010-09-03 DIAGNOSIS — M6281 Muscle weakness (generalized): Secondary | ICD-10-CM | POA: Insufficient documentation

## 2010-09-03 DIAGNOSIS — R269 Unspecified abnormalities of gait and mobility: Secondary | ICD-10-CM | POA: Insufficient documentation

## 2010-09-03 DIAGNOSIS — IMO0001 Reserved for inherently not codable concepts without codable children: Secondary | ICD-10-CM | POA: Insufficient documentation

## 2010-09-05 ENCOUNTER — Ambulatory Visit: Payer: 59 | Admitting: Physical Therapy

## 2010-09-09 ENCOUNTER — Ambulatory Visit: Payer: 59

## 2010-09-11 ENCOUNTER — Ambulatory Visit: Payer: 59

## 2010-09-15 ENCOUNTER — Ambulatory Visit: Payer: 59

## 2010-09-18 ENCOUNTER — Ambulatory Visit: Payer: 59

## 2010-09-24 ENCOUNTER — Ambulatory Visit: Payer: 59

## 2010-09-26 ENCOUNTER — Ambulatory Visit: Payer: 59 | Admitting: Physical Therapy

## 2010-09-29 ENCOUNTER — Ambulatory Visit: Payer: 59

## 2011-01-22 ENCOUNTER — Other Ambulatory Visit: Payer: Self-pay | Admitting: Family Medicine

## 2011-01-22 DIAGNOSIS — Z1231 Encounter for screening mammogram for malignant neoplasm of breast: Secondary | ICD-10-CM

## 2011-02-09 ENCOUNTER — Ambulatory Visit (INDEPENDENT_AMBULATORY_CARE_PROVIDER_SITE_OTHER): Payer: PRIVATE HEALTH INSURANCE | Admitting: Psychology

## 2011-02-09 DIAGNOSIS — F4322 Adjustment disorder with anxiety: Secondary | ICD-10-CM

## 2011-02-27 ENCOUNTER — Ambulatory Visit
Admission: RE | Admit: 2011-02-27 | Discharge: 2011-02-27 | Disposition: A | Discharge status: Home / Self Care | Payer: PRIVATE HEALTH INSURANCE | Source: Ambulatory Visit | Attending: Family Medicine | Admitting: Family Medicine

## 2011-02-27 DIAGNOSIS — Z1231 Encounter for screening mammogram for malignant neoplasm of breast: Secondary | ICD-10-CM

## 2011-08-04 ENCOUNTER — Other Ambulatory Visit (HOSPITAL_COMMUNITY)
Admission: RE | Admit: 2011-08-04 | Discharge: 2011-08-04 | Disposition: A | Discharge status: Home / Self Care | Payer: PRIVATE HEALTH INSURANCE | Source: Ambulatory Visit | Attending: Family Medicine | Admitting: Family Medicine

## 2011-08-04 DIAGNOSIS — Z0000 Encounter for general adult medical examination without abnormal findings: Secondary | ICD-10-CM | POA: Insufficient documentation

## 2011-10-01 ENCOUNTER — Ambulatory Visit (INDEPENDENT_AMBULATORY_CARE_PROVIDER_SITE_OTHER): Payer: No Typology Code available for payment source | Admitting: Psychology

## 2011-10-01 DIAGNOSIS — F4322 Adjustment disorder with anxiety: Secondary | ICD-10-CM

## 2012-02-01 ENCOUNTER — Other Ambulatory Visit: Payer: Self-pay | Admitting: Family Medicine

## 2012-02-01 DIAGNOSIS — Z1231 Encounter for screening mammogram for malignant neoplasm of breast: Secondary | ICD-10-CM

## 2012-03-07 ENCOUNTER — Ambulatory Visit
Admission: RE | Admit: 2012-03-07 | Discharge: 2012-03-07 | Disposition: A | Discharge status: Home / Self Care | Payer: PRIVATE HEALTH INSURANCE | Source: Ambulatory Visit | Attending: Family Medicine | Admitting: Family Medicine

## 2012-03-07 DIAGNOSIS — Z1231 Encounter for screening mammogram for malignant neoplasm of breast: Secondary | ICD-10-CM

## 2013-02-07 ENCOUNTER — Other Ambulatory Visit: Payer: Self-pay

## 2013-02-07 DIAGNOSIS — Z1231 Encounter for screening mammogram for malignant neoplasm of breast: Secondary | ICD-10-CM

## 2013-03-09 ENCOUNTER — Ambulatory Visit
Admission: RE | Admit: 2013-03-09 | Discharge: 2013-03-09 | Disposition: A | Discharge status: Home / Self Care | Payer: BC Managed Care – PPO | Source: Ambulatory Visit

## 2013-03-09 DIAGNOSIS — Z1231 Encounter for screening mammogram for malignant neoplasm of breast: Secondary | ICD-10-CM

## 2014-01-22 ENCOUNTER — Other Ambulatory Visit (HOSPITAL_COMMUNITY)
Admission: RE | Admit: 2014-01-22 | Discharge: 2014-01-22 | Disposition: A | Discharge status: Home / Self Care | Payer: BC Managed Care – PPO | Source: Ambulatory Visit | Attending: Family Medicine | Admitting: Family Medicine

## 2014-01-22 ENCOUNTER — Other Ambulatory Visit: Payer: Self-pay | Admitting: Family Medicine

## 2014-01-22 DIAGNOSIS — Z01419 Encounter for gynecological examination (general) (routine) without abnormal findings: Secondary | ICD-10-CM | POA: Diagnosis present

## 2014-01-23 LAB — CYTOLOGY - PAP

## 2014-02-06 ENCOUNTER — Other Ambulatory Visit: Payer: Self-pay

## 2014-02-06 DIAGNOSIS — Z1231 Encounter for screening mammogram for malignant neoplasm of breast: Secondary | ICD-10-CM

## 2014-05-07 ENCOUNTER — Other Ambulatory Visit: Payer: Self-pay | Admitting: Dermatology

## 2014-05-09 ENCOUNTER — Ambulatory Visit
Admission: RE | Admit: 2014-05-09 | Discharge: 2014-05-09 | Disposition: A | Discharge status: Home / Self Care | Payer: PRIVATE HEALTH INSURANCE | Source: Ambulatory Visit

## 2014-05-09 DIAGNOSIS — Z1231 Encounter for screening mammogram for malignant neoplasm of breast: Secondary | ICD-10-CM

## 2015-05-08 ENCOUNTER — Other Ambulatory Visit: Payer: Self-pay

## 2015-05-08 DIAGNOSIS — Z1231 Encounter for screening mammogram for malignant neoplasm of breast: Secondary | ICD-10-CM

## 2015-05-16 MED FILL — FLUOROURACIL 5% CREAM: 5 | 14 days supply | Qty: 40 | Fill #0

## 2015-05-27 ENCOUNTER — Ambulatory Visit
Admission: RE | Admit: 2015-05-27 | Discharge: 2015-05-27 | Disposition: A | Discharge status: Home / Self Care | Payer: BLUE CROSS/BLUE SHIELD | Source: Ambulatory Visit

## 2015-05-27 DIAGNOSIS — Z1231 Encounter for screening mammogram for malignant neoplasm of breast: Secondary | ICD-10-CM

## 2016-05-05 ENCOUNTER — Other Ambulatory Visit: Payer: Self-pay | Admitting: Family Medicine

## 2016-05-05 DIAGNOSIS — Z1231 Encounter for screening mammogram for malignant neoplasm of breast: Secondary | ICD-10-CM

## 2016-06-04 ENCOUNTER — Ambulatory Visit
Admission: RE | Admit: 2016-06-04 | Discharge: 2016-06-04 | Disposition: A | Discharge status: Home / Self Care | Payer: BLUE CROSS/BLUE SHIELD | Source: Ambulatory Visit | Attending: Family Medicine | Admitting: Family Medicine

## 2016-06-04 DIAGNOSIS — Z1231 Encounter for screening mammogram for malignant neoplasm of breast: Secondary | ICD-10-CM

## 2016-06-08 ENCOUNTER — Other Ambulatory Visit: Payer: Self-pay | Admitting: Family Medicine

## 2016-06-08 DIAGNOSIS — Z1211 Encounter for screening for malignant neoplasm of colon: Secondary | ICD-10-CM

## 2016-06-23 ENCOUNTER — Other Ambulatory Visit: Payer: Self-pay | Admitting: Radiology

## 2016-06-23 ENCOUNTER — Ambulatory Visit
Admission: RE | Admit: 2016-06-23 | Discharge: 2016-06-23 | Disposition: A | Discharge status: Home / Self Care | Payer: BLUE CROSS/BLUE SHIELD | Source: Ambulatory Visit | Attending: Family Medicine | Admitting: Family Medicine

## 2016-06-23 DIAGNOSIS — Z1211 Encounter for screening for malignant neoplasm of colon: Secondary | ICD-10-CM

## 2016-06-30 ENCOUNTER — Other Ambulatory Visit: Payer: Self-pay | Admitting: Family Medicine

## 2016-06-30 DIAGNOSIS — K769 Liver disease, unspecified: Secondary | ICD-10-CM

## 2016-06-30 DIAGNOSIS — N2889 Other specified disorders of kidney and ureter: Secondary | ICD-10-CM

## 2016-07-10 ENCOUNTER — Ambulatory Visit
Admission: RE | Admit: 2016-07-10 | Discharge: 2016-07-10 | Disposition: A | Discharge status: Home / Self Care | Payer: BLUE CROSS/BLUE SHIELD | Source: Ambulatory Visit | Attending: Family Medicine | Admitting: Family Medicine

## 2016-07-10 DIAGNOSIS — N2889 Other specified disorders of kidney and ureter: Secondary | ICD-10-CM

## 2016-07-10 DIAGNOSIS — K769 Liver disease, unspecified: Secondary | ICD-10-CM

## 2016-11-25 ENCOUNTER — Other Ambulatory Visit: Payer: Self-pay | Admitting: Family Medicine

## 2016-11-25 ENCOUNTER — Other Ambulatory Visit (HOSPITAL_COMMUNITY)
Admission: RE | Admit: 2016-11-25 | Discharge: 2016-11-25 | Disposition: A | Discharge status: Home / Self Care | Payer: BLUE CROSS/BLUE SHIELD | Source: Ambulatory Visit | Attending: Family Medicine | Admitting: Family Medicine

## 2016-11-25 DIAGNOSIS — Z124 Encounter for screening for malignant neoplasm of cervix: Secondary | ICD-10-CM | POA: Diagnosis present

## 2016-11-25 MED FILL — ALPRAZolam 0.5 MG TABS: 0.5 | 15 days supply | Qty: 15 | Fill #0

## 2016-11-25 MED FILL — ZOLPIDEM TARTRATE 10 MG TAB: 10 | 15 days supply | Qty: 15 | Fill #0

## 2016-11-27 LAB — CYTOLOGY - PAP: Diagnosis: NEGATIVE

## 2017-04-28 ENCOUNTER — Other Ambulatory Visit: Payer: Self-pay | Admitting: Family Medicine

## 2017-04-28 DIAGNOSIS — Z1231 Encounter for screening mammogram for malignant neoplasm of breast: Secondary | ICD-10-CM

## 2017-06-07 ENCOUNTER — Ambulatory Visit
Admission: RE | Admit: 2017-06-07 | Discharge: 2017-06-07 | Disposition: A | Discharge status: Home / Self Care | Payer: PRIVATE HEALTH INSURANCE | Source: Ambulatory Visit | Attending: Family Medicine | Admitting: Family Medicine

## 2017-06-07 DIAGNOSIS — Z1231 Encounter for screening mammogram for malignant neoplasm of breast: Secondary | ICD-10-CM

## 2017-07-13 MED FILL — ZOLPIDEM TARTRATE 10 MG TAB: 10 | 30 days supply | Qty: 15 | Fill #0

## 2017-07-13 MED FILL — ALPRAZolam 0.5 MG TABS: 0.5 | 30 days supply | Qty: 15 | Fill #0

## 2018-03-16 DIAGNOSIS — Z0000 Encounter for general adult medical examination without abnormal findings: Secondary | ICD-10-CM | POA: Diagnosis not present

## 2018-03-16 DIAGNOSIS — Z1389 Encounter for screening for other disorder: Secondary | ICD-10-CM | POA: Diagnosis not present

## 2018-03-16 DIAGNOSIS — G4700 Insomnia, unspecified: Secondary | ICD-10-CM | POA: Diagnosis not present

## 2018-03-16 DIAGNOSIS — Z23 Encounter for immunization: Secondary | ICD-10-CM | POA: Diagnosis not present

## 2018-03-16 DIAGNOSIS — F419 Anxiety disorder, unspecified: Secondary | ICD-10-CM | POA: Diagnosis not present

## 2018-03-16 DIAGNOSIS — E7800 Pure hypercholesterolemia, unspecified: Secondary | ICD-10-CM | POA: Diagnosis not present

## 2018-03-16 MED FILL — ALPRAZolam 0.5 MG TABS: 0.5 | 30 days supply | Qty: 30 | Fill #0

## 2018-05-06 ENCOUNTER — Other Ambulatory Visit: Payer: Self-pay | Admitting: Family Medicine

## 2018-05-06 DIAGNOSIS — Z1231 Encounter for screening mammogram for malignant neoplasm of breast: Secondary | ICD-10-CM

## 2018-05-09 ENCOUNTER — Other Ambulatory Visit: Payer: Self-pay | Admitting: Family Medicine

## 2018-05-09 DIAGNOSIS — E2839 Other primary ovarian failure: Secondary | ICD-10-CM

## 2018-05-19 ENCOUNTER — Observation Stay (HOSPITAL_COMMUNITY): Payer: PPO | Admitting: Certified Registered Nurse Anesthetist

## 2018-05-19 ENCOUNTER — Encounter (HOSPITAL_COMMUNITY): Payer: Self-pay

## 2018-05-19 ENCOUNTER — Observation Stay (HOSPITAL_COMMUNITY)
Admission: EM | Admit: 2018-05-19 | Discharge: 2018-05-20 | Disposition: A | Discharge status: Home / Self Care | Payer: PPO | Attending: General Surgery | Admitting: General Surgery

## 2018-05-19 ENCOUNTER — Encounter (HOSPITAL_COMMUNITY)
Admission: EM | Disposition: A | Discharge status: Home / Self Care | Payer: Self-pay | Source: Home / Self Care | Attending: Emergency Medicine

## 2018-05-19 ENCOUNTER — Ambulatory Visit
Admission: RE | Admit: 2018-05-19 | Discharge: 2018-05-19 | Disposition: A | Discharge status: Home / Self Care | Payer: PPO | Source: Ambulatory Visit | Attending: Family Medicine | Admitting: Family Medicine

## 2018-05-19 ENCOUNTER — Other Ambulatory Visit: Payer: Self-pay

## 2018-05-19 ENCOUNTER — Other Ambulatory Visit: Payer: Self-pay | Admitting: Family Medicine

## 2018-05-19 DIAGNOSIS — K37 Unspecified appendicitis: Secondary | ICD-10-CM | POA: Diagnosis present

## 2018-05-19 DIAGNOSIS — R1031 Right lower quadrant pain: Secondary | ICD-10-CM | POA: Diagnosis not present

## 2018-05-19 DIAGNOSIS — Z880 Allergy status to penicillin: Secondary | ICD-10-CM | POA: Insufficient documentation

## 2018-05-19 DIAGNOSIS — F419 Anxiety disorder, unspecified: Secondary | ICD-10-CM | POA: Insufficient documentation

## 2018-05-19 DIAGNOSIS — K5730 Diverticulosis of large intestine without perforation or abscess without bleeding: Secondary | ICD-10-CM | POA: Insufficient documentation

## 2018-05-19 DIAGNOSIS — M1611 Unilateral primary osteoarthritis, right hip: Secondary | ICD-10-CM | POA: Insufficient documentation

## 2018-05-19 DIAGNOSIS — K7689 Other specified diseases of liver: Secondary | ICD-10-CM | POA: Insufficient documentation

## 2018-05-19 DIAGNOSIS — K3580 Unspecified acute appendicitis: Secondary | ICD-10-CM | POA: Diagnosis not present

## 2018-05-19 DIAGNOSIS — Z79899 Other long term (current) drug therapy: Secondary | ICD-10-CM | POA: Insufficient documentation

## 2018-05-19 DIAGNOSIS — Z7289 Other problems related to lifestyle: Secondary | ICD-10-CM | POA: Diagnosis not present

## 2018-05-19 HISTORY — PX: LAPAROSCOPIC APPENDECTOMY: SHX408

## 2018-05-19 HISTORY — DX: Adverse effect of unspecified anesthetic, initial encounter: T41.45XA

## 2018-05-19 HISTORY — DX: Other specified postprocedural states: Z98.890

## 2018-05-19 HISTORY — DX: Nausea with vomiting, unspecified: R11.2

## 2018-05-19 HISTORY — DX: Other complications of anesthesia, initial encounter: T88.59XA

## 2018-05-19 LAB — URINALYSIS, ROUTINE W REFLEX MICROSCOPIC
Bacteria, UA: NONE SEEN
Bilirubin Urine: NEGATIVE
Glucose, UA: NEGATIVE mg/dL
Hgb urine dipstick: NEGATIVE
Ketones, ur: 5 mg/dL — AB
Nitrite: NEGATIVE
Protein, ur: NEGATIVE mg/dL
Specific Gravity, Urine: >1.046 — ABNORMAL HIGH (ref 1.005–1.030)
pH: 6 (ref 5.0–8.0)

## 2018-05-19 LAB — CBC WITH DIFFERENTIAL/PLATELET
Abs Immature Granulocytes: 0.02 10*3/uL (ref 0.00–0.07)
Basophils Absolute: 0 10*3/uL (ref 0.0–0.1)
Basophils Relative: 0 %
Eosinophils Absolute: 0.1 10*3/uL (ref 0.0–0.5)
Eosinophils Relative: 1 %
HCT: 45.4 % (ref 36.0–46.0)
Hemoglobin: 14.3 g/dL (ref 12.0–15.0)
Immature Granulocytes: 0 %
Lymphocytes Relative: 18 %
Lymphs Abs: 1.9 10*3/uL (ref 0.7–4.0)
MCH: 30.8 pg (ref 26.0–34.0)
MCHC: 31.5 g/dL (ref 30.0–36.0)
MCV: 97.8 fL (ref 80.0–100.0)
Monocytes Absolute: 0.7 10*3/uL (ref 0.1–1.0)
Monocytes Relative: 7 %
Neutro Abs: 7.9 10*3/uL — ABNORMAL HIGH (ref 1.7–7.7)
Neutrophils Relative %: 74 %
Platelets: 182 10*3/uL (ref 150–400)
RBC: 4.64 MIL/uL (ref 3.87–5.11)
RDW: 12.9 % (ref 11.5–15.5)
WBC: 10.7 10*3/uL — ABNORMAL HIGH (ref 4.0–10.5)
nRBC: 0 % (ref 0.0–0.2)

## 2018-05-19 LAB — COMPREHENSIVE METABOLIC PANEL
ALT: 12 U/L (ref 0–44)
AST: 24 U/L (ref 15–41)
Albumin: 4.1 g/dL (ref 3.5–5.0)
Alkaline Phosphatase: 57 U/L (ref 38–126)
Anion gap: 14 (ref 5–15)
BUN: 9 mg/dL (ref 8–23)
CO2: 21 mmol/L — ABNORMAL LOW (ref 22–32)
Calcium: 9 mg/dL (ref 8.9–10.3)
Chloride: 103 mmol/L (ref 98–111)
Creatinine, Ser: 0.76 mg/dL (ref 0.44–1.00)
GFR calc Af Amer: >60 mL/min (ref >60)
GFR calc non Af Amer: >60 mL/min (ref >60)
Glucose, Bld: 101 mg/dL — ABNORMAL HIGH (ref 70–99)
Potassium: 3.9 mmol/L (ref 3.5–5.1)
Sodium: 138 mmol/L (ref 135–145)
Total Bilirubin: 0.9 mg/dL (ref 0.3–1.2)
Total Protein: 6.7 g/dL (ref 6.5–8.1)

## 2018-05-19 LAB — LIPASE, BLOOD: Lipase: 25 U/L (ref 11–51)

## 2018-05-19 SURGERY — APPENDECTOMY, LAPAROSCOPIC
Anesthesia: General

## 2018-05-19 MED ORDER — MORPHINE SULFATE (PF) 2 MG/ML IV SOLN: 2.0000 mg | INTRAVENOUS | Status: DC | PRN

## 2018-05-19 MED ORDER — ONDANSETRON HCL 4 MG/2ML IJ SOLN
INTRAMUSCULAR | Status: AC
  Filled 2018-05-19: qty 2

## 2018-05-19 MED ORDER — ONDANSETRON HCL 4 MG/2ML IJ SOLN: 4.0000 mg | Freq: Four times a day (QID) | INTRAMUSCULAR | Status: DC | PRN

## 2018-05-19 MED ORDER — OXYCODONE HCL 5 MG PO TABS: 5.0000 mg | ORAL_TABLET | ORAL | Status: DC | PRN

## 2018-05-19 MED ORDER — BUPIVACAINE HCL (PF) 0.25 % IJ SOLN
INTRAMUSCULAR | Status: AC
  Filled 2018-05-19: qty 30

## 2018-05-19 MED ORDER — PROPOFOL 10 MG/ML IV BOLUS
INTRAVENOUS | Status: DC | PRN
  Administered 2018-05-19: 200 mg via INTRAVENOUS

## 2018-05-19 MED ORDER — 0.9 % SODIUM CHLORIDE (POUR BTL) OPTIME
TOPICAL | Status: DC | PRN
  Administered 2018-05-19: 1000 mL

## 2018-05-19 MED ORDER — SODIUM CHLORIDE 0.9 % IV SOLN: INTRAVENOUS | Status: DC

## 2018-05-19 MED ORDER — METOCLOPRAMIDE HCL 5 MG/ML IJ SOLN
INTRAMUSCULAR | Status: DC | PRN
  Administered 2018-05-19 (×2): 5 mg via INTRAVENOUS

## 2018-05-19 MED ORDER — ACETAMINOPHEN 500 MG PO TABS
ORAL_TABLET | ORAL | Status: AC
  Administered 2018-05-19: 1000 mg via ORAL
  Filled 2018-05-19: qty 2

## 2018-05-19 MED ORDER — DIPHENHYDRAMINE HCL 50 MG/ML IJ SOLN
INTRAMUSCULAR | Status: AC
  Filled 2018-05-19: qty 1

## 2018-05-19 MED ORDER — EPHEDRINE SULFATE 50 MG/ML IJ SOLN
INTRAMUSCULAR | Status: DC | PRN
  Administered 2018-05-19: 10 mg via INTRAVENOUS

## 2018-05-19 MED ORDER — FENTANYL CITRATE (PF) 250 MCG/5ML IJ SOLN
INTRAMUSCULAR | Status: DC | PRN
  Administered 2018-05-19: 100 ug via INTRAVENOUS

## 2018-05-19 MED ORDER — SUCCINYLCHOLINE CHLORIDE 200 MG/10ML IV SOSY
PREFILLED_SYRINGE | INTRAVENOUS | Status: AC
  Filled 2018-05-19: qty 10

## 2018-05-19 MED ORDER — SUGAMMADEX SODIUM 200 MG/2ML IV SOLN
INTRAVENOUS | Status: DC | PRN
  Administered 2018-05-19: 110.6 mg via INTRAVENOUS

## 2018-05-19 MED ORDER — ROCURONIUM BROMIDE 50 MG/5ML IV SOSY
PREFILLED_SYRINGE | INTRAVENOUS | Status: DC | PRN
  Administered 2018-05-19: 50 mg via INTRAVENOUS

## 2018-05-19 MED ORDER — PHENYLEPHRINE HCL (PRESSORS) 10 MG/ML IV SOLN
INTRAVENOUS | Status: DC | PRN
  Administered 2018-05-19: 120 ug via INTRAVENOUS
  Administered 2018-05-19 (×2): 80 ug via INTRAVENOUS

## 2018-05-19 MED ORDER — GABAPENTIN 300 MG PO CAPS
300.0000 mg | ORAL_CAPSULE | ORAL | Status: AC
  Administered 2018-05-19: 16:00:00 300 mg via ORAL

## 2018-05-19 MED ORDER — SCOPOLAMINE 1 MG/3DAYS TD PT72
MEDICATED_PATCH | TRANSDERMAL | Status: AC
  Administered 2018-05-19: 1.5 mg via TRANSDERMAL
  Filled 2018-05-19: qty 1

## 2018-05-19 MED ORDER — LIDOCAINE 2% (20 MG/ML) 5 ML SYRINGE
INTRAMUSCULAR | Status: DC | PRN
  Administered 2018-05-19: 100 mg via INTRAVENOUS

## 2018-05-19 MED ORDER — METRONIDAZOLE IN NACL 5-0.79 MG/ML-% IV SOLN
500.0000 mg | Freq: Once | INTRAVENOUS | Status: AC
  Administered 2018-05-19: 500 mg via INTRAVENOUS
  Filled 2018-05-19: qty 100

## 2018-05-19 MED ORDER — DIPHENHYDRAMINE HCL 50 MG/ML IJ SOLN
INTRAMUSCULAR | Status: DC | PRN
  Administered 2018-05-19: 6.25 mg via INTRAVENOUS

## 2018-05-19 MED ORDER — ACETAMINOPHEN 500 MG PO TABS
1000.0000 mg | ORAL_TABLET | Freq: Four times a day (QID) | ORAL | Status: DC
  Administered 2018-05-19 – 2018-05-20 (×2): 1000 mg via ORAL
  Filled 2018-05-19 (×2): qty 2

## 2018-05-19 MED ORDER — MIDAZOLAM HCL 2 MG/2ML IJ SOLN
INTRAMUSCULAR | Status: AC
  Filled 2018-05-19: qty 2

## 2018-05-19 MED ORDER — DEXAMETHASONE SODIUM PHOSPHATE 10 MG/ML IJ SOLN
INTRAMUSCULAR | Status: DC | PRN
  Administered 2018-05-19: 10 mg via INTRAVENOUS

## 2018-05-19 MED ORDER — SCOPOLAMINE 1 MG/3DAYS TD PT72
1.0000 | MEDICATED_PATCH | TRANSDERMAL | Status: DC
  Administered 2018-05-19: 16:00:00 1.5 mg via TRANSDERMAL

## 2018-05-19 MED ORDER — SODIUM CHLORIDE 0.9 % IV BOLUS
1000.0000 mL | Freq: Once | INTRAVENOUS | Status: AC
  Administered 2018-05-19: 1000 mL via INTRAVENOUS

## 2018-05-19 MED ORDER — ONDANSETRON HCL 4 MG/2ML IJ SOLN
INTRAMUSCULAR | Status: DC | PRN
  Administered 2018-05-19: 4 mg via INTRAVENOUS

## 2018-05-19 MED ORDER — FENTANYL CITRATE (PF) 250 MCG/5ML IJ SOLN
INTRAMUSCULAR | Status: AC
  Filled 2018-05-19: qty 5

## 2018-05-19 MED ORDER — IOPAMIDOL (ISOVUE-300) INJECTION 61%
100.0000 mL | Freq: Once | INTRAVENOUS | Status: AC | PRN
  Administered 2018-05-19: 100 mL via INTRAVENOUS

## 2018-05-19 MED ORDER — DIPHENHYDRAMINE HCL 12.5 MG/5ML PO ELIX: 12.5000 mg | ORAL_SOLUTION | Freq: Four times a day (QID) | ORAL | Status: DC | PRN

## 2018-05-19 MED ORDER — BUPIVACAINE HCL 0.25 % IJ SOLN
INTRAMUSCULAR | Status: DC | PRN
  Administered 2018-05-19: 5 mL

## 2018-05-19 MED ORDER — GABAPENTIN 300 MG PO CAPS
ORAL_CAPSULE | ORAL | Status: AC
  Administered 2018-05-19: 300 mg via ORAL
  Filled 2018-05-19: qty 1

## 2018-05-19 MED ORDER — LACTATED RINGERS IV SOLN
INTRAVENOUS | Status: DC
  Administered 2018-05-19 (×2): via INTRAVENOUS

## 2018-05-19 MED ORDER — MIDAZOLAM HCL 2 MG/2ML IJ SOLN
INTRAMUSCULAR | Status: DC | PRN
  Administered 2018-05-19: 2 mg via INTRAVENOUS

## 2018-05-19 MED ORDER — METOCLOPRAMIDE HCL 5 MG/ML IJ SOLN
INTRAMUSCULAR | Status: AC
  Filled 2018-05-19: qty 2

## 2018-05-19 MED ORDER — ROCURONIUM BROMIDE 50 MG/5ML IV SOSY
PREFILLED_SYRINGE | INTRAVENOUS | Status: AC
  Filled 2018-05-19: qty 5

## 2018-05-19 MED ORDER — SUCCINYLCHOLINE CHLORIDE 200 MG/10ML IV SOSY
PREFILLED_SYRINGE | INTRAVENOUS | Status: DC | PRN
  Administered 2018-05-19: 120 mg via INTRAVENOUS

## 2018-05-19 MED ORDER — PANTOPRAZOLE SODIUM 40 MG IV SOLR
40.0000 mg | Freq: Every day | INTRAVENOUS | Status: DC
  Administered 2018-05-19: 40 mg via INTRAVENOUS
  Filled 2018-05-19: qty 40

## 2018-05-19 MED ORDER — PROMETHAZINE HCL 25 MG/ML IJ SOLN: 12.5000 mg | Freq: Four times a day (QID) | INTRAMUSCULAR | Status: DC | PRN

## 2018-05-19 MED ORDER — DIPHENHYDRAMINE HCL 50 MG/ML IJ SOLN: 12.5000 mg | Freq: Four times a day (QID) | INTRAMUSCULAR | Status: DC | PRN

## 2018-05-19 MED ORDER — DEXAMETHASONE SODIUM PHOSPHATE 10 MG/ML IJ SOLN
INTRAMUSCULAR | Status: AC
  Filled 2018-05-19: qty 1

## 2018-05-19 MED ORDER — KETOROLAC TROMETHAMINE 15 MG/ML IJ SOLN
15.0000 mg | Freq: Three times a day (TID) | INTRAMUSCULAR | Status: DC | PRN
  Administered 2018-05-19 – 2018-05-20 (×2): 15 mg via INTRAVENOUS
  Filled 2018-05-19 (×2): qty 1

## 2018-05-19 MED ORDER — LIDOCAINE 2% (20 MG/ML) 5 ML SYRINGE
INTRAMUSCULAR | Status: AC
  Filled 2018-05-19: qty 5

## 2018-05-19 MED ORDER — HYDRALAZINE HCL 20 MG/ML IJ SOLN: 10.0000 mg | INTRAMUSCULAR | Status: DC | PRN

## 2018-05-19 MED ORDER — ONDANSETRON 4 MG PO TBDP: 4.0000 mg | ORAL_TABLET | Freq: Four times a day (QID) | ORAL | Status: DC | PRN

## 2018-05-19 MED ORDER — ENOXAPARIN SODIUM 40 MG/0.4ML ~~LOC~~ SOLN
40.0000 mg | SUBCUTANEOUS | Status: DC
  Administered 2018-05-20: 40 mg via SUBCUTANEOUS
  Filled 2018-05-19: qty 0.4

## 2018-05-19 MED ORDER — PHENYLEPHRINE 40 MCG/ML (10ML) SYRINGE FOR IV PUSH (FOR BLOOD PRESSURE SUPPORT)
PREFILLED_SYRINGE | INTRAVENOUS | Status: AC
  Filled 2018-05-19: qty 10

## 2018-05-19 MED ORDER — ACETAMINOPHEN 500 MG PO TABS
1000.0000 mg | ORAL_TABLET | ORAL | Status: AC
  Administered 2018-05-19: 16:00:00 1000 mg via ORAL

## 2018-05-19 MED ORDER — SODIUM CHLORIDE 0.9 % IR SOLN
Status: DC | PRN
  Administered 2018-05-19: 1000 mL

## 2018-05-19 MED ORDER — SODIUM CHLORIDE 0.9 % IV SOLN
2.0000 g | Freq: Once | INTRAVENOUS | Status: AC
  Administered 2018-05-19: 2 g via INTRAVENOUS
  Filled 2018-05-19: qty 20

## 2018-05-19 SURGICAL SUPPLY — 40 items
APPLIER CLIP 5 13 M/L LIGAMAX5 (MISCELLANEOUS)
BLADE CLIPPER SURG (BLADE) IMPLANT
CANISTER SUCT 3000ML PPV (MISCELLANEOUS) ×2 IMPLANT
CHLORAPREP W/TINT 26ML (MISCELLANEOUS) ×2 IMPLANT
CLIP APPLIE 5 13 M/L LIGAMAX5 (MISCELLANEOUS) IMPLANT
CLIP VESOLOCK XL 6/CT (CLIP) ×2 IMPLANT
COVER SURGICAL LIGHT HANDLE (MISCELLANEOUS) ×2 IMPLANT
COVER TRANSDUCER ULTRASND (DRAPES) ×1 IMPLANT
COVER WAND RF STERILE (DRAPES) ×1 IMPLANT
DERMABOND ADVANCED (GAUZE/BANDAGES/DRESSINGS) ×1
DERMABOND ADVANCED .7 DNX12 (GAUZE/BANDAGES/DRESSINGS) ×1 IMPLANT
ELECT REM PT RETURN 9FT ADLT (ELECTROSURGICAL) ×2
ELECTRODE REM PT RTRN 9FT ADLT (ELECTROSURGICAL) ×1 IMPLANT
ENDOLOOP SUT PDS II  0 18 (SUTURE)
ENDOLOOP SUT PDS II 0 18 (SUTURE) IMPLANT
GLOVE BIO SURGEON STRL SZ7.5 (GLOVE) ×2 IMPLANT
GOWN STRL REUS W/ TWL LRG LVL3 (GOWN DISPOSABLE) ×2 IMPLANT
GOWN STRL REUS W/ TWL XL LVL3 (GOWN DISPOSABLE) ×1 IMPLANT
GOWN STRL REUS W/TWL LRG LVL3 (GOWN DISPOSABLE) ×2
GOWN STRL REUS W/TWL XL LVL3 (GOWN DISPOSABLE) ×1
GRASPER SUT TROCAR 14GX15 (MISCELLANEOUS) ×2 IMPLANT
KIT BASIN OR (CUSTOM PROCEDURE TRAY) ×2 IMPLANT
KIT TURNOVER KIT B (KITS) ×2 IMPLANT
NDL INSUFFLATION 14GA 120MM (NEEDLE) ×1 IMPLANT
NEEDLE INSUFFLATION 14GA 120MM (NEEDLE) ×2 IMPLANT
NS IRRIG 1000ML POUR BTL (IV SOLUTION) ×2 IMPLANT
PAD ARMBOARD 7.5X6 YLW CONV (MISCELLANEOUS) ×4 IMPLANT
SCISSORS LAP 5X35 DISP (ENDOMECHANICALS) ×2 IMPLANT
SET IRRIG TUBING LAPAROSCOPIC (IRRIGATION / IRRIGATOR) ×2 IMPLANT
SET TUBE SMOKE EVAC HIGH FLOW (TUBING) ×2 IMPLANT
SLEEVE ENDOPATH XCEL 5M (ENDOMECHANICALS) ×2 IMPLANT
SPECIMEN JAR SMALL (MISCELLANEOUS) ×2 IMPLANT
SUT MNCRL AB 4-0 PS2 18 (SUTURE) ×2 IMPLANT
TOWEL OR 17X24 6PK STRL BLUE (TOWEL DISPOSABLE) ×2 IMPLANT
TOWEL OR 17X26 10 PK STRL BLUE (TOWEL DISPOSABLE) ×1 IMPLANT
TRAY FOLEY CATH SILVER 16FR (SET/KITS/TRAYS/PACK) ×2 IMPLANT
TRAY LAPAROSCOPIC MC (CUSTOM PROCEDURE TRAY) ×2 IMPLANT
TROCAR XCEL NON-BLD 11X100MML (ENDOMECHANICALS) ×2 IMPLANT
TROCAR XCEL NON-BLD 5MMX100MML (ENDOMECHANICALS) ×2 IMPLANT
WATER STERILE IRR 1000ML POUR (IV SOLUTION) ×2 IMPLANT

## 2018-05-19 NOTE — ED Triage Notes (Signed)
Pt reports abdominal pain that started last night, she was seen by her PCP today and had CT scan which revealed acute appendicitis.

## 2018-05-19 NOTE — Anesthesia Postprocedure Evaluation (Signed)
Anesthesia Post Note  Patient: Melissa [name]  Procedure(s) Performed: APPENDECTOMY LAPAROSCOPIC (N/A )     Patient location during evaluation: PACU Anesthesia Type: General Level of consciousness: awake and alert Pain management: pain level controlled Vital Signs Assessment: post-procedure vital signs reviewed and stable Respiratory status: spontaneous breathing, nonlabored ventilation, respiratory function stable and patient connected to nasal cannula oxygen Cardiovascular status: blood pressure returned to baseline and stable Postop Assessment: no apparent nausea or vomiting Anesthetic complications: no    Last Vitals:  Vitals:   05/19/18 1803 05/19/18 1807  BP: (!) 154/93 (!) 161/87  Pulse: 83 81  Resp: 15 (!) 22  Temp:    SpO2: 98% 97%    Last Pain:  Vitals:   05/19/18 1758  TempSrc:   PainSc: 0-No pain                 Carolanne Mercier COKER

## 2018-05-19 NOTE — ED Notes (Addendum)
ED TO INPATIENT HANDOFF REPORT  ED Nurse Name and Phone #:  Lonn Georgia [phone]  S Name/Age/Gender Melissa [name] 63 y.o. female Room/Bed: 022C/022C  Code Status   Code Status: Not on file  Home/SNF/Other Home Patient oriented to: self, place, time and situation Is this baseline? Yes   Triage Complete: Triage complete  Chief Complaint abd pain  Triage Note Pt reports abdominal pain that started last night, she was seen by her PCP today and had CT scan which revealed acute appendicitis.    Allergies Allergies  Allergen Reactions  . Penicillins Rash    Did it involve swelling of the face/tongue/throat, SOB, or low BP? No Did it involve sudden or severe rash/hives, skin peeling, or any reaction on the inside of your mouth or nose? Yes Did you need to seek medical attention at a hospital or doctor's office? No When did it last happen?unknown If all above answers are "NO", may proceed with cephalosporin use.     Level of Care/Admitting Diagnosis ED Disposition    ED Disposition Condition [facility] Area: [place] [[id number]]  Level of Care: Med-Surg [16]  Diagnosis: Appendicitis [654650]  Admitting Physician: CCS, Sloan  Attending Physician: CCS, MD [[id number]]  Covid Evaluation: N/A  Bed request comments: 6N  PT Class (Do Not Modify): Observation [104]  PT Acc Code (Do Not Modify): Observation [10022]       B Medical/Surgery History No past medical history on file. No past surgical history on file.   A IV Location/Drains/Wounds Patient Lines/Drains/Airways Status   Active Line/Drains/Airways    Name:   Placement date:   Placement time:   Site:   Days:   Peripheral IV 05/19/18 Right Antecubital   05/19/18    1340    Antecubital   less than 1          Intake/Output Last 24 hours No intake or output data in the 24 hours ending 05/19/18 1458  Labs/Imaging Results for orders placed or performed during the hospital  encounter of 05/19/18 (from the past 48 hour(s))  CBC with Differential     Status: Abnormal   Collection Time: 05/19/18  1:48 PM  Result Value Ref Range   WBC 10.7 (H) 4.0 - 10.5 K/uL   RBC 4.64 3.87 - 5.11 MIL/uL   Hemoglobin 14.3 12.0 - 15.0 g/dL   HCT 45.4 36.0 - 46.0 %   MCV 97.8 80.0 - 100.0 fL   MCH 30.8 26.0 - 34.0 pg   MCHC 31.5 30.0 - 36.0 g/dL   RDW 12.9 11.5 - 15.5 %   Platelets 182 150 - 400 K/uL   nRBC 0.0 0.0 - 0.2 %   Neutrophils Relative % 74 %   Neutro Abs 7.9 (H) 1.7 - 7.7 K/uL   Lymphocytes Relative 18 %   Lymphs Abs 1.9 0.7 - 4.0 K/uL   Monocytes Relative 7 %   Monocytes Absolute 0.7 0.1 - 1.0 K/uL   Eosinophils Relative 1 %   Eosinophils Absolute 0.1 0.0 - 0.5 K/uL   Basophils Relative 0 %   Basophils Absolute 0.0 0.0 - 0.1 K/uL   Immature Granulocytes 0 %   Abs Immature Granulocytes 0.02 0.00 - 0.07 K/uL    Comment: Performed at [facility], [address] [place], [place] [address]  Comprehensive metabolic panel     Status: Abnormal   Collection Time: 05/19/18  1:48 PM  Result Value Ref Range  Sodium 138 135 - 145 mmol/L   Potassium 3.9 3.5 - 5.1 mmol/L   Chloride 103 98 - 111 mmol/L   CO2 21 (L) 22 - 32 mmol/L   Glucose, Bld 101 (H) 70 - 99 mg/dL   BUN 9 8 - 23 mg/dL   Creatinine, Ser 0.76 0.44 - 1.00 mg/dL   Calcium 9.0 8.9 - 10.3 mg/dL   Total Protein 6.7 6.5 - 8.1 g/dL   Albumin 4.1 3.5 - 5.0 g/dL   AST 24 15 - 41 U/L   ALT 12 0 - 44 U/L   Alkaline Phosphatase 57 38 - 126 U/L   Total Bilirubin 0.9 0.3 - 1.2 mg/dL   GFR calc non Af Amer >60 >60 mL/min   GFR calc Af Amer >60 >60 mL/min   Anion gap 14 5 - 15    Comment: Performed at [facility] [address], [place], [place] [address]  Lipase, blood     Status: None   Collection Time: 05/19/18  1:48 PM  Result Value Ref Range   Lipase 25 11 - 51 U/L    Comment: Performed at [facility] [address] [place], [place] [address]  Urinalysis, Routine w reflex  microscopic     Status: Abnormal   Collection Time: 05/19/18  2:10 PM  Result Value Ref Range   Color, Urine YELLOW YELLOW   APPearance CLEAR CLEAR   Specific Gravity, Urine >1.046 (H) 1.005 - 1.030   pH 6.0 5.0 - 8.0   Glucose, UA NEGATIVE NEGATIVE mg/dL   Hgb urine dipstick NEGATIVE NEGATIVE   Bilirubin Urine NEGATIVE NEGATIVE   Ketones, ur 5 (A) NEGATIVE mg/dL   Protein, ur NEGATIVE NEGATIVE mg/dL   Nitrite NEGATIVE NEGATIVE   Leukocytes,Ua MODERATE (A) NEGATIVE   RBC / HPF 0-5 0 - 5 RBC/hpf   WBC, UA 0-5 0 - 5 WBC/hpf   Bacteria, UA NONE SEEN NONE SEEN   Squamous Epithelial / LPF 0-5 0 - 5    Comment: Performed at [facility], [place] [address], [place], [place] [address]   Ct Abdomen Pelvis W Contrast  Result Date: 05/19/2018 CLINICAL DATA:  Lower abdominal pain and fever EXAM: CT ABDOMEN AND PELVIS WITH CONTRAST TECHNIQUE: Multidetector CT imaging of the abdomen and pelvis was performed using the standard protocol following bolus administration of intravenous contrast. CONTRAST:  11mL ISOVUE-300 IOPAMIDOL (ISOVUE-300) INJECTION 61% Creatinine 0.7; GFR 91 COMPARISON:  Jun 23, 2016 FINDINGS: Lower chest: There is a small calcified granuloma in the right lower lobe. There is no lung base edema or consolidation. Hepatobiliary: There are scattered cysts throughout the liver. Largest liver cyst is in the right lobe measuring just under 2 x 2 cm. No non cystic liver lesions are evident. Gallbladder wall is not appreciably thickened. There is no biliary duct dilatation. Pancreas: No pancreatic mass or inflammatory focus. Spleen: No splenic lesion evident. Adrenals/Urinary Tract: Adrenals appear normal bilaterally. There is a cyst arising from the posterior upper to mid right kidney measuring 2.7 x 2.8 cm. There is no evident hydronephrosis on either side. There is no appreciable renal or ureteral calculus on either side. Urinary bladder is midline with wall thickness within normal limits.  Stomach/Bowel: There are multiple sigmoid diverticula without diverticulitis. There is moderate stool in the colon. There is no appreciable bowel wall thickening. No evident bowel obstruction. No free air or portal venous air. Vascular/Lymphatic: There is no abdominal aortic aneurysm. Aorta is mildly tortuous.  No vascular lesions evident. No evident adenopathy in the abdomen or pelvis. Reproductive: No pelvic masses are evident. Other: The appendix measures 1.2 cm in thickness. There is enhancement of the wall of the appendix. There is periappendiceal region inflammation. No periappendiceal abscess or evident perforation. No abscess or ascites is evident in the abdomen or pelvis. Musculoskeletal: There is degenerative change in the lumbar spine. 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 There are no blastic or lytic bone lesions. No intramuscular or abdominal wall lesions. IMPRESSION: 1.  Findings indicative of acute appendicitis. Appendix: Location: Appendix arises inferiorly from the cecum located in the lateral right upper pelvic region. Diameter: 12 mm. Appendicolith: None Mucosal hyper-enhancement: Diffuse Extraluminal gas: None Periappendiceal collection: None. There is periappendiceal soft tissue stranding. 2. Sigmoid diverticula without diverticulitis. No bowel obstruction. No abscess in the abdomen or pelvis. 3.  No evident renal or ureteral calculi.  No hydronephrosis. Critical Value/emergent results were called by telephone at the time of interpretation on 05/19/2018 at 12:24 pm to Dr. Carol Ada , who verbally acknowledged these results. Electronically Signed   By: Lowella Grip III M.D.   On: 05/19/2018 12:25    Pending Labs [employer] (From admission, onward)    Start     Ordered   Signed and Held  HIV antibody (Routine Testing)  Once,   R     Signed and Held   Signed and Held  CBC  (enoxaparin (LOVENOX)    CrCl >/= 30 ml/min)  Once,   R    Comments:  Baseline for enoxaparin therapy IF NOT  ALREADY DRAWN.  Notify MD if PLT < 100 K.    Signed and Held   Signed and Held  Creatinine, serum  (enoxaparin (LOVENOX)    CrCl >/= 30 ml/min)  Once,   R    Comments:  Baseline for enoxaparin therapy IF NOT ALREADY DRAWN.    Signed and Held   Signed and Held  Creatinine, serum  (enoxaparin (LOVENOX)    CrCl >/= 30 ml/min)  Weekly,   R    Comments:  while on enoxaparin therapy    Signed and Held          Vitals/Pain Today's Vitals   05/19/18 1345 05/19/18 1400 05/19/18 1415 05/19/18 1430  BP: (!) 162/142 (!) 145/96 112/73 (!) 161/93  Pulse: 86 68 74 79  Resp:      Temp:      TempSrc:      SpO2: 99% 99% 99% 95%  PainSc:        Isolation Precautions No active isolations  Medications Medications  cefTRIAXone (ROCEPHIN) 2 g in sodium chloride 0.9 % 100 mL IVPB (has no administration in time range)    And  metroNIDAZOLE (FLAGYL) IVPB 500 mg (500 mg Intravenous New Bag/Given 05/19/18 1414)  sodium chloride 0.9 % bolus 1,000 mL (1,000 mLs Intravenous New Bag/Given 05/19/18 1414)    Mobility walks Low fall risk   Focused Assessments    R Recommendations: See Admitting Provider Note  Report given to:   Additional Notes:

## 2018-05-19 NOTE — Op Note (Signed)
05/19/2018  5:27 PM  PATIENT:  Melissa [name]  63 y.o. female  PRE-OPERATIVE DIAGNOSIS:  Acute Appendicitis  POST-OPERATIVE DIAGNOSIS:  Acute Appendicitis  PROCEDURE:  Procedure(s): APPENDECTOMY LAPAROSCOPIC (N/A)  SURGEON:  Surgeon(s) and Role:    Ralene Ok, MD - Primary  ANESTHESIA:   local and general  EBL:  minimal   BLOOD ADMINISTERED:none  DRAINS: none   LOCAL MEDICATIONS USED:  BUPIVICAINE   SPECIMEN:  Source of Specimen:  appendix  DISPOSITION OF SPECIMEN:  PATHOLOGY  COUNTS:  YES  TOURNIQUET:  * No tourniquets in log *  DICTATION: .Dragon Dictation Complications: none  Counts: reported as correct x 2  Findings:  The patient had a acutely inflamed appendix  Specimen: Appendix  Indications for procedure:  The patient is a 64 year old female with a history of periumbilical pain localized in the right lower quadrant patient had a CT scan which revealed signs consistent with acute appendicitis the patient back in for laparoscopic appendectomy.  Details of the procedure:The patient was taken back to the operating room. The patient was placed in supine position with bilateral SCDs in place.  A foley catheter was place. The patient was prepped and draped in the usual sterile fashion.  After appropriate anitbiotics were confirmed, a time-out was confirmed and all facts were verified.    A pneumoperitoneum of 14 mmHg was obtained via a Veress needle technique in the left lower quadrant quadrant.  A 5 mm trocar and 5 mm camera then placed intra-abdominally there is no injury to any intra-abdominal organs a 10 mm infraumbilical port was placed and direct visualization as was a 5 mm port in the suprapubic area.   The appendix was identified and seen to be non-perforated.  The appendix was cleaned down to the appendiceal base. The mesoappendix was then incised and the appendiceal artery was cauterized.  The the appendiceal base was clean.  At this time an  hemoclip, gold, was placed proximallyx2 and one distally and the appendix was transected between these 2. A retrieval bag was then placed into the abdomen and the specimen placed in the bag. The appendiceal stump was cauterized. We evacuate the fluid from the pelvis until the effluent was clear.  The appendix and retrieval  bag was then retrieved via the supraumbilical port. #1 Vicryl  was used to reapproximate the fascia at the umbilical port site x2. The skin was reapproximated all port sites 3-0 Monocryl subcuticular fashion. The skin was dressed with Dermabond.  The patient had the foley removed. The patient was awakened from general anesthesia was taken to recovery room in stable condition.      PLAN OF CARE: Admit for overnight observation  PATIENT DISPOSITION:  PACU - hemodynamically stable.   Delay start of Pharmacological VTE agent (>24hrs) due to surgical blood loss or risk of bleeding: yes

## 2018-05-19 NOTE — ED Provider Notes (Signed)
[facility] DEPARTMENT Provider Note   CSN: [id number] Arrival date & time: 05/19/18  1309    History   Chief Complaint Chief Complaint  Patient presents with  . Abdominal Pain    HPI Melissa [name] is a 63 y.o. female.     The history is provided by the patient and medical records. No language interpreter was used.  Abdominal Pain     63 year old female sent here from PCP for evaluation of abdominal pain.  Patient developed pain to her right lower quadrant abdomen which started last night.  Pain is sharp, crampy, mild currently.  Endorses mild nausea without vomiting or diarrhea.  No associated fever chills no dysuria.  She did had a telemedicine conference with her PCP this morning in regard to her symptoms.  She had an abdominal and pelvis CT scan performed today that demonstrated evidence of acute appendicitis without complication.  She was sent here for further management of her condition.  Last meal was last night.  No past medical history on file.  Patient Active Problem List   Diagnosis Date Noted  . OSTEOARTHRITIS, HIP, RIGHT 01/06/2010  . HIP PAIN, RIGHT, CHRONIC 01/06/2010  . UNEQUAL LEG LENGTH 01/06/2010    No past surgical history on file.   OB History   No obstetric history on file.      Home Medications    Prior to Admission medications   Not on File    Family History Family History  Problem Relation Age of Onset  . Breast cancer Mother     Social History Social History   Tobacco Use  . Smoking status: Never Smoker  . Smokeless tobacco: Never Used  Substance Use Topics  . Alcohol use: Yes  . Drug use: Not on file     Allergies   Penicillins   Review of Systems Review of Systems  Gastrointestinal: Positive for abdominal pain.  All other systems reviewed and are negative.    Physical Exam Updated Vital Signs BP (!) 153/100   Pulse 78   Temp 98.2 F (36.8 C) (Oral)   Resp 16   SpO2 99%    Physical Exam Vitals signs and nursing note reviewed.  Constitutional:      General: She is not in acute distress.    Appearance: She is well-developed.  HENT:     Head: Atraumatic.  Eyes:     Conjunctiva/sclera: Conjunctivae normal.  Neck:     Musculoskeletal: Neck supple.  Cardiovascular:     Rate and Rhythm: Normal rate and regular rhythm.  Pulmonary:     Effort: Pulmonary effort is normal.     Breath sounds: Normal breath sounds.  Abdominal:     General: Abdomen is flat.     Palpations: Abdomen is soft.     Tenderness: There is abdominal tenderness in the right lower quadrant. There is guarding. There is no rebound. Positive signs include McBurney's sign. Negative signs include Murphy's sign.     Hernia: No hernia is present.  Skin:    Findings: No rash.  Neurological:     Mental Status: She is alert.      ED Treatments / Results  Labs (all labs ordered are listed, but only abnormal results are displayed) Labs Reviewed  CBC WITH DIFFERENTIAL/PLATELET - Abnormal; Notable for the following components:      Result Value   WBC 10.7 (*)    Neutro Abs 7.9 (*)    All other components within normal  limits  COMPREHENSIVE METABOLIC PANEL - Abnormal; Notable for the following components:   CO2 21 (*)    Glucose, Bld 101 (*)    All other components within normal limits  URINALYSIS, ROUTINE W REFLEX MICROSCOPIC - Abnormal; Notable for the following components:   Specific Gravity, Urine >1.046 (*)    Ketones, ur 5 (*)    Leukocytes,Ua MODERATE (*)    All other components within normal limits  LIPASE, BLOOD    EKG None  Radiology Ct Abdomen Pelvis W Contrast  Result Date: 05/19/2018 CLINICAL DATA:  Lower abdominal pain and fever EXAM: CT ABDOMEN AND PELVIS WITH CONTRAST TECHNIQUE: Multidetector CT imaging of the abdomen and pelvis was performed using the standard protocol following bolus administration of intravenous contrast. CONTRAST:  146mL ISOVUE-300 IOPAMIDOL  (ISOVUE-300) INJECTION 61% Creatinine 0.7; GFR 91 COMPARISON:  Jun 23, 2016 FINDINGS: Lower chest: There is a small calcified granuloma in the right lower lobe. There is no lung base edema or consolidation. Hepatobiliary: There are scattered cysts throughout the liver. Largest liver cyst is in the right lobe measuring just under 2 x 2 cm. No non cystic liver lesions are evident. Gallbladder wall is not appreciably thickened. There is no biliary duct dilatation. Pancreas: No pancreatic mass or inflammatory focus. Spleen: No splenic lesion evident. Adrenals/Urinary Tract: Adrenals appear normal bilaterally. There is a cyst arising from the posterior upper to mid right kidney measuring 2.7 x 2.8 cm. There is no evident hydronephrosis on either side. There is no appreciable renal or ureteral calculus on either side. Urinary bladder is midline with wall thickness within normal limits. Stomach/Bowel: There are multiple sigmoid diverticula without diverticulitis. There is moderate stool in the colon. There is no appreciable bowel wall thickening. No evident bowel obstruction. No free air or portal venous air. Vascular/Lymphatic: There is no abdominal aortic aneurysm. Aorta is mildly tortuous. No vascular lesions evident. No evident adenopathy in the abdomen or pelvis. Reproductive: No pelvic masses are evident. Other: The appendix measures 1.2 cm in thickness. There is enhancement of the wall of the appendix. There is periappendiceal region inflammation. No periappendiceal abscess or evident perforation. No abscess or ascites is evident in the abdomen or pelvis. Musculoskeletal: There is degenerative change in the lumbar spine. 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 There are no blastic or lytic bone lesions. No intramuscular or abdominal wall lesions. IMPRESSION: 1.  Findings indicative of acute appendicitis. Appendix: Location: Appendix arises inferiorly from the cecum located in the lateral right upper pelvic region.  Diameter: 12 mm. Appendicolith: None Mucosal hyper-enhancement: Diffuse Extraluminal gas: None Periappendiceal collection: None. There is periappendiceal soft tissue stranding. 2. Sigmoid diverticula without diverticulitis. No bowel obstruction. No abscess in the abdomen or pelvis. 3.  No evident renal or ureteral calculi.  No hydronephrosis. Critical Value/emergent results were called by telephone at the time of interpretation on 05/19/2018 at 12:24 pm to Dr. Carol Ada , who verbally acknowledged these results. Electronically Signed   By: Lowella Grip III M.D.   On: 05/19/2018 12:25    Procedures Procedures (including critical care time)  Medications Ordered in ED Medications  cefTRIAXone (ROCEPHIN) 2 g in sodium chloride 0.9 % 100 mL IVPB (has no administration in time range)    And  metroNIDAZOLE (FLAGYL) IVPB 500 mg (500 mg Intravenous New Bag/Given 05/19/18 1414)  sodium chloride 0.9 % bolus 1,000 mL (1,000 mLs Intravenous New Bag/Given 05/19/18 1414)     Initial Impression / Assessment and Plan / ED Course  I have reviewed the triage vital signs and the nursing notes.  Pertinent labs & imaging results that were available during my care of the patient were reviewed by me and considered in my medical decision making (see chart for details).        BP (!) 161/93   Pulse 79   Temp 98.2 F (36.8 C) (Oral)   Resp 16   SpO2 95%    Final Clinical Impressions(s) / ED Diagnoses   Final diagnoses:  Acute appendicitis, unspecified acute appendicitis type    ED Discharge Orders    None     2:03 PM Patient nonverbal abdominal pain and had an abdominal pelvis CT scan performed today confirm evidence of acute appendicitis.  She is currently n.p.o.  Surgeon Dr. Rosendo Gros has seen and evaluated pt. Plan for surgery today.  He request pt to be placed on antibiotics.  Will give Rocephin and Metronidazole.  Pt does have hx of PCN allergy with rash.  Will monitor closely.  Care  discussed with DR. Lockwood.   2:53 PM Offer pt to contact family member for update. Pt declined, stating she has spoken with her loved ones already.  She is ready for surgery.  Pt currently resting comfortably and in no acute distress.  Does not want any pain medication currently.     Domenic Moras, PA-C 05/19/18 1456    Carmin Muskrat, MD 05/20/18 [phone]

## 2018-05-19 NOTE — Transfer of Care (Signed)
Immediate Anesthesia Transfer of Care Note  Patient: Melissa [name]  Procedure(s) Performed: APPENDECTOMY LAPAROSCOPIC (N/A )  Patient Location: PACU  Anesthesia Type:General  Level of Consciousness: awake, alert  and patient cooperative  Airway & Oxygen Therapy: Patient Spontanous Breathing and Patient connected to face mask oxygen  Post-op Assessment: Report given to RN and Post -op Vital signs reviewed and stable  Post vital signs: Reviewed and stable  Last Vitals:  Vitals Value Taken Time  BP    Temp    Pulse    Resp 15 05/19/2018  5:55 PM  SpO2    Vitals shown include unvalidated device data.  Last Pain:  Vitals:   05/19/18 1555  TempSrc:   PainSc: 0-No pain      Patients Stated Pain Goal: 2 (25/85/27 7824)  Complications: No apparent anesthesia complications

## 2018-05-19 NOTE — Anesthesia Procedure Notes (Signed)
Procedure Name: Intubation Date/Time: 05/19/2018 4:49 PM Performed by: Kathryne Hitch, CRNA Pre-anesthesia Checklist: Patient identified, Emergency Drugs available, Suction available, Patient being monitored and Timeout performed Patient Re-evaluated:Patient Re-evaluated prior to induction Oxygen Delivery Method: Circle system utilized Preoxygenation: Pre-oxygenation with 100% oxygen Induction Type: IV induction Laryngoscope Size: Glidescope and 4 Grade View: Grade II Tube type: Oral Tube size: 7.0 mm Number of attempts: 2 Airway Equipment and Method: Stylet Placement Confirmation: ETT inserted through vocal cords under direct vision,  positive ETCO2 and breath sounds checked- equal and bilateral Secured at: 22 cm Tube secured with: Tape Dental Injury: Teeth and Oropharynx as per pre-operative assessment  Difficulty Due To: Difficulty was unanticipated and Difficult Airway- due to anterior larynx Comments: CRNA Sabra Heck 2 x1 Grade 3 view. MDA Miller 3 x1 Grade 3 view. Grade 1 view with Glidescope + patient anterior larynx.

## 2018-05-19 NOTE — Anesthesia Preprocedure Evaluation (Addendum)
Anesthesia Evaluation  Patient identified by MRN, date of birth, ID band Patient awake    Reviewed: Allergy & Precautions, NPO status , Patient's Chart, lab work & pertinent test results  History of Anesthesia Complications (+) PONV and history of anesthetic complications  Airway Mallampati: III  TM Distance: >3 FB Neck ROM: Full  Mouth opening: Limited Mouth Opening  Dental no notable dental hx. (+) Teeth Intact, Dental Advisory Given   Pulmonary neg pulmonary ROS,    Pulmonary exam normal breath sounds clear to auscultation       Cardiovascular negative cardio ROS Normal cardiovascular exam Rhythm:Regular Rate:Normal     Neuro/Psych PSYCHIATRIC DISORDERS Anxiety negative neurological ROS     GI/Hepatic negative GI ROS, Neg liver ROS,   Endo/Other  negative endocrine ROS  Renal/GU negative Renal ROS  negative genitourinary   Musculoskeletal  (+) Arthritis ,   Abdominal   Peds negative pediatric ROS (+)  Hematology negative hematology ROS (+)   Anesthesia Other Findings   Reproductive/Obstetrics negative OB ROS                           Anesthesia Physical Anesthesia Plan  ASA: II  Anesthesia Plan: General   Post-op Pain Management:    Induction: Intravenous  PONV Risk Score and Plan: 4 or greater and Ondansetron, Dexamethasone, Midazolam, Treatment may vary due to age or medical condition and Scopolamine patch - Pre-op  Airway Management Planned: Oral ETT  Additional Equipment:   Intra-op Plan:   Post-operative Plan: Extubation in OR  Informed Consent: I have reviewed the patients History and Physical, chart, labs and discussed the procedure including the risks, benefits and alternatives for the proposed anesthesia with the patient or authorized representative who has indicated his/her understanding and acceptance.     Dental advisory given  Plan Discussed with:  CRNA  Anesthesia Plan Comments:        Anesthesia Quick Evaluation

## 2018-05-19 NOTE — H&P (Signed)
Melissa [name] is an 63 y.o. female.   Chief Complaint: Abdominal pain HPI: Patient is a 63 year old female comes in with approximately 18-hour history of abdominal pain.  Patient states that the pain began in bilateral lower quadrants.  She states that overnight the pain localized to the right lower quadrant.  Patient spoke with her PCP this morning underwent CT scan.  CT scan was consistent with acute appendicitis.  Patient denies any nausea or vomiting.  Patient does state she had a low-grade fever of 99 degrees.  Patient's had no previous abdominal surgery.  No past medical history on file.  No past surgical history on file.  Family History  Problem Relation Age of Onset  . Breast cancer Mother    Social History:  reports that she has never smoked. She has never used smokeless tobacco. She reports current alcohol use. No history on file for drug.  Allergies:  Allergies  Allergen Reactions  . Penicillins Rash    (Not in a hospital admission)   Results for orders placed or performed during the hospital encounter of 05/19/18 (from the past 48 hour(s))  CBC with Differential     Status: Abnormal   Collection Time: 05/19/18  1:48 PM  Result Value Ref Range   WBC 10.7 (H) 4.0 - 10.5 K/uL   RBC 4.64 3.87 - 5.11 MIL/uL   Hemoglobin 14.3 12.0 - 15.0 g/dL   HCT 45.4 36.0 - 46.0 %   MCV 97.8 80.0 - 100.0 fL   MCH 30.8 26.0 - 34.0 pg   MCHC 31.5 30.0 - 36.0 g/dL   RDW 12.9 11.5 - 15.5 %   Platelets 182 150 - 400 K/uL   nRBC 0.0 0.0 - 0.2 %   Neutrophils Relative % 74 %   Neutro Abs 7.9 (H) 1.7 - 7.7 K/uL   Lymphocytes Relative 18 %   Lymphs Abs 1.9 0.7 - 4.0 K/uL   Monocytes Relative 7 %   Monocytes Absolute 0.7 0.1 - 1.0 K/uL   Eosinophils Relative 1 %   Eosinophils Absolute 0.1 0.0 - 0.5 K/uL   Basophils Relative 0 %   Basophils Absolute 0.0 0.0 - 0.1 K/uL   Immature Granulocytes 0 %   Abs Immature Granulocytes 0.02 0.00 - 0.07 K/uL    Comment: Performed at [facility], [address] [place], [place] [address]   Ct Abdomen Pelvis W Contrast  Result Date: 05/19/2018 CLINICAL DATA:  Lower abdominal pain and fever EXAM: CT ABDOMEN AND PELVIS WITH CONTRAST TECHNIQUE: Multidetector CT imaging of the abdomen and pelvis was performed using the standard protocol following bolus administration of intravenous contrast. CONTRAST:  159mL ISOVUE-300 IOPAMIDOL (ISOVUE-300) INJECTION 61% Creatinine 0.7; GFR 91 COMPARISON:  Jun 23, 2016 FINDINGS: Lower chest: There is a small calcified granuloma in the right lower lobe. There is no lung base edema or consolidation. Hepatobiliary: There are scattered cysts throughout the liver. Largest liver cyst is in the right lobe measuring just under 2 x 2 cm. No non cystic liver lesions are evident. Gallbladder wall is not appreciably thickened. There is no biliary duct dilatation. Pancreas: No pancreatic mass or inflammatory focus. Spleen: No splenic lesion evident. Adrenals/Urinary Tract: Adrenals appear normal bilaterally. There is a cyst arising from the posterior upper to mid right kidney measuring 2.7 x 2.8 cm. There is no evident hydronephrosis on either side. There is no appreciable renal or ureteral calculus on either side. Urinary bladder is midline with wall thickness within normal  limits. Stomach/Bowel: There are multiple sigmoid diverticula without diverticulitis. There is moderate stool in the colon. There is no appreciable bowel wall thickening. No evident bowel obstruction. No free air or portal venous air. Vascular/Lymphatic: There is no abdominal aortic aneurysm. Aorta is mildly tortuous. No vascular lesions evident. No evident adenopathy in the abdomen or pelvis. Reproductive: No pelvic masses are evident. Other: The appendix measures 1.2 cm in thickness. There is enhancement of the wall of the appendix. There is periappendiceal region inflammation. No periappendiceal abscess or evident perforation. No abscess or ascites is  evident in the abdomen or pelvis. Musculoskeletal: There is degenerative change in the lumbar spine. 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 There are no blastic or lytic bone lesions. No intramuscular or abdominal wall lesions. IMPRESSION: 1.  Findings indicative of acute appendicitis. Appendix: Location: Appendix arises inferiorly from the cecum located in the lateral right upper pelvic region. Diameter: 12 mm. Appendicolith: None Mucosal hyper-enhancement: Diffuse Extraluminal gas: None Periappendiceal collection: None. There is periappendiceal soft tissue stranding. 2. Sigmoid diverticula without diverticulitis. No bowel obstruction. No abscess in the abdomen or pelvis. 3.  No evident renal or ureteral calculi.  No hydronephrosis. Critical Value/emergent results were called by telephone at the time of interpretation on 05/19/2018 at 12:24 pm to Dr. Carol Ada , who verbally acknowledged these results. Electronically Signed   By: Lowella Grip III M.D.   On: 05/19/2018 12:25    Review of Systems  Constitutional: Positive for fever. Negative for chills and malaise/fatigue.  HENT: Negative for ear discharge, hearing loss and sore throat.   Eyes: Negative for blurred vision and discharge.  Respiratory: Negative for cough and shortness of breath.   Cardiovascular: Negative for chest pain, orthopnea and leg swelling.  Gastrointestinal: Positive for abdominal pain and nausea. Negative for constipation, diarrhea, heartburn and vomiting.  Musculoskeletal: Negative for myalgias and neck pain.  Skin: Negative for itching and rash.  Neurological: Negative for dizziness, focal weakness, seizures and loss of consciousness.  Endo/Heme/Allergies: Negative for environmental allergies. Does not bruise/bleed easily.  Psychiatric/Behavioral: Negative for depression and suicidal ideas.  All other systems reviewed and are negative.   Blood pressure (!) 162/142, pulse 86, temperature 98.2 F (36.8 C), temperature  source Oral, resp. rate 16, SpO2 99 %. Physical Exam  Constitutional: She is oriented to person, place, and time. Vital signs are normal. She appears well-developed and well-nourished.  Conversant No acute distress  Eyes: Lids are normal. No scleral icterus.  No lid lag Moist conjunctiva  Neck: No tracheal tenderness present. No thyromegaly present.  No cervical lymphadenopathy  Cardiovascular: Normal rate, regular rhythm and intact distal pulses.  No murmur heard. Respiratory: Effort normal and breath sounds normal. She has no wheezes. She has no rales.  GI: Soft. Bowel sounds are normal. She exhibits no distension. There is no hepatosplenomegaly. There is abdominal tenderness (RLQ). There is no rebound. No hernia.  Neurological: She is alert and oriented to person, place, and time.  Normal gait and station  Skin: Skin is warm. No rash noted. No cyanosis. Nails show no clubbing.  Normal skin turgor  Psychiatric: Judgment normal.  Appropriate affect     Assessment/Plan 73 F with acute appendicitis Abx Will plan for OR later this PM, lap appy I discussed with the patient the risks benefits of the procedure to include but not limited to: Infection, bleeding, damage to surrounding structures, possible ileus, possible postoperative infection. Patient voiced understanding and wishes to proceed.   Ralene Ok,  MD 05/19/2018, 2:03 PM

## 2018-05-20 ENCOUNTER — Encounter (HOSPITAL_COMMUNITY): Payer: Self-pay | Admitting: General Surgery

## 2018-05-20 LAB — HIV ANTIBODY (ROUTINE TESTING W REFLEX): HIV Screen 4th Generation wRfx: NONREACTIVE

## 2018-05-20 MED ORDER — ACETAMINOPHEN 500 MG PO TABS: 500.0000 mg | ORAL_TABLET | ORAL | 0 refills | Status: AC | PRN

## 2018-05-20 MED ORDER — TRAMADOL HCL 50 MG PO TABS: 50.0000 mg | ORAL_TABLET | Freq: Four times a day (QID) | ORAL | 0 refills | Status: AC | PRN

## 2018-05-20 MED FILL — traMADol HCL 50 MG TABS: 50 | 5 days supply | Qty: 20 | Fill #0

## 2018-05-20 NOTE — Discharge Summary (Signed)
[facility] Discharge Summary   Patient ID: PELAGIA [name] MRN: [id number] DOB/AGE: Apr 25, 1955 63 y.o.  Admit date: 05/19/2018 Discharge date: 05/20/2018  Admitting Diagnosis: appendicitis  Discharge Diagnosis Patient Active Problem List   Diagnosis Date Noted  . Appendicitis 05/19/2018  . OSTEOARTHRITIS, HIP, RIGHT 01/06/2010  . HIP PAIN, RIGHT, CHRONIC 01/06/2010  . UNEQUAL LEG LENGTH 01/06/2010    Consultants none  Imaging: Ct Abdomen Pelvis W Contrast  Result Date: 05/19/2018 CLINICAL DATA:  Lower abdominal pain and fever EXAM: CT ABDOMEN AND PELVIS WITH CONTRAST TECHNIQUE: Multidetector CT imaging of the abdomen and pelvis was performed using the standard protocol following bolus administration of intravenous contrast. CONTRAST:  140mL ISOVUE-300 IOPAMIDOL (ISOVUE-300) INJECTION 61% Creatinine 0.7; GFR 91 COMPARISON:  Jun 23, 2016 FINDINGS: Lower chest: There is a small calcified granuloma in the right lower lobe. There is no lung base edema or consolidation. Hepatobiliary: There are scattered cysts throughout the liver. Largest liver cyst is in the right lobe measuring just under 2 x 2 cm. No non cystic liver lesions are evident. Gallbladder wall is not appreciably thickened. There is no biliary duct dilatation. Pancreas: No pancreatic mass or inflammatory focus. Spleen: No splenic lesion evident. Adrenals/Urinary Tract: Adrenals appear normal bilaterally. There is a cyst arising from the posterior upper to mid right kidney measuring 2.7 x 2.8 cm. There is no evident hydronephrosis on either side. There is no appreciable renal or ureteral calculus on either side. Urinary bladder is midline with wall thickness within normal limits. Stomach/Bowel: There are multiple sigmoid diverticula without diverticulitis. There is moderate stool in the colon. There is no appreciable bowel wall thickening. No evident bowel obstruction. No free air or portal venous air.  Vascular/Lymphatic: There is no abdominal aortic aneurysm. Aorta is mildly tortuous. No vascular lesions evident. No evident adenopathy in the abdomen or pelvis. Reproductive: No pelvic masses are evident. Other: The appendix measures 1.2 cm in thickness. There is enhancement of the wall of the appendix. There is periappendiceal region inflammation. No periappendiceal abscess or evident perforation. No abscess or ascites is evident in the abdomen or pelvis. Musculoskeletal: There is degenerative change in the lumbar spine. 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 There are no blastic or lytic bone lesions. No intramuscular or abdominal wall lesions. IMPRESSION: 1.  Findings indicative of acute appendicitis. Appendix: Location: Appendix arises inferiorly from the cecum located in the lateral right upper pelvic region. Diameter: 12 mm. Appendicolith: None Mucosal hyper-enhancement: Diffuse Extraluminal gas: None Periappendiceal collection: None. There is periappendiceal soft tissue stranding. 2. Sigmoid diverticula without diverticulitis. No bowel obstruction. No abscess in the abdomen or pelvis. 3.  No evident renal or ureteral calculi.  No hydronephrosis. Critical Value/emergent results were called by telephone at the time of interpretation on 05/19/2018 at 12:24 pm to Dr. Carol Ada , who verbally acknowledged these results. Electronically Signed   By: Lowella Grip III M.D.   On: 05/19/2018 12:25    Procedures Dr. Rosendo Gros (05/19/18) - Laparoscopic Appendectomy  HPI: Patient is a 63 year old female comes in with approximately 18-hour history of abdominal pain.  Patient states that the pain began in bilateral lower quadrants.  She states that overnight the pain localized to the right lower quadrant.  Patient spoke with her PCP this morning underwent CT scan.  CT scan was consistent with acute appendicitis.  Patient denies any nausea or vomiting.  Patient does state she had a low-grade fever of 99  degrees.  Patient's had no previous abdominal  surgery.  Hospital Course:  Workup showed appendicitis .  Patient was admitted and underwent procedure listed above.  Tolerated procedure well and was transferred to the floor.  Diet was advanced as tolerated.  On POD#1, the patient was voiding well, tolerating diet, ambulating well, pain well controlled, vital signs stable, incisions c/d/i and felt stable for discharge home.  Patient will follow up as outlined below and knows to call with questions or concerns.     Patient was discharged in good condition.  The [place] Substance controlled database was reviewed prior to prescribing narcotic pain medication to this patient.  Physical Exam: General:  Alert, NAD, pleasant, cooperative, sitting on side of bed eating breatkfast Cardio: RRR, S1 & S2 normal, no murmur, rubs, gallops Resp: rate and effort normal, lungs CTA bilaterally, no wheezes, rales, rhonchi Abd:  Soft, ND, incisions with glue intact appear well with mild surrounding ecchymosis, no drainage or bleeding noted Skin: warm and dry, no rashes noted  Allergies as of 05/20/2018      Reactions   Penicillins Rash   Did it involve swelling of the face/tongue/throat, SOB, or low BP? No Did it involve sudden or severe rash/hives, skin peeling, or any reaction on the inside of your mouth or nose? Yes Did you need to seek medical attention at a hospital or doctor's office? No When did it last happen?unknown If all above answers are "NO", may proceed with cephalosporin use.      Medication List    TAKE these medications   acetaminophen 500 MG tablet Commonly known as:  TYLENOL Take 1 tablet (500 mg total) by mouth every 4 (four) hours as needed for mild pain.   ALPRAZolam 0.5 MG tablet Commonly known as:  XANAX Take 0.5 mg by mouth at bedtime as needed for anxiety.   traMADol 50 MG tablet Commonly known as:  Ultram Take 1 tablet (50 mg total) by mouth every 6 (six) hours as  needed for moderate pain.   zolpidem 10 MG tablet Commonly known as:  AMBIEN Take 5 mg by mouth at bedtime as needed for sleep.        Follow-up [facility] Follow up on 06/02/2018.   Specialty:  General Surgery Why:  a provider will call you on 04/30 at 2:30pm. please take a photo of your incisions and send to [email or web address] a few days before your appt. Please include your name and date of birth in the subject.  Contact information: [address] [place] [phone]          Signed: Fraser [facility] 05/20/2018, 8:48 AM Pager: [phone] Consults: [phone] Mon-Fri 7:00 am-4:30 pm Sat-Sun 7:00 am-11:30 am

## 2018-05-20 NOTE — Progress Notes (Signed)
Melissa [name] to be D/C'd per MD order. Discussed with the patient and all questions fully answered. ? VSS, Skin clean, dry and intact without evidence of skin break down, no evidence of skin tears noted. ? IV catheter discontinued intact. Site without signs and symptoms of complications. Dressing and pressure applied. ? An After Visit Summary was printed and given to the patient. Patient informed where to pickup prescriptions. ? D/c education completed with patient/family including follow up instructions, medication list, d/c activities limitations if indicated, with other d/c instructions as indicated by MD - patient able to verbalize understanding, all questions fully answered.  ? Patient instructed to return to ED, call 911, or call MD for any changes in condition.  ? Patient to be escorted via [place], and D/C home via private auto.

## 2018-05-20 NOTE — Discharge Instructions (Signed)

## 2018-05-30 DIAGNOSIS — L821 Other seborrheic keratosis: Secondary | ICD-10-CM | POA: Diagnosis not present

## 2018-05-30 DIAGNOSIS — Z85828 Personal history of other malignant neoplasm of skin: Secondary | ICD-10-CM | POA: Diagnosis not present

## 2018-05-30 DIAGNOSIS — L570 Actinic keratosis: Secondary | ICD-10-CM | POA: Diagnosis not present

## 2018-08-08 ENCOUNTER — Ambulatory Visit
Admission: RE | Admit: 2018-08-08 | Discharge: 2018-08-08 | Disposition: A | Discharge status: Home / Self Care | Payer: PPO | Source: Ambulatory Visit | Attending: Family Medicine | Admitting: Family Medicine

## 2018-08-08 ENCOUNTER — Other Ambulatory Visit: Payer: Self-pay

## 2018-08-08 DIAGNOSIS — Z1231 Encounter for screening mammogram for malignant neoplasm of breast: Secondary | ICD-10-CM | POA: Diagnosis not present

## 2018-08-08 DIAGNOSIS — Z780 Asymptomatic menopausal state: Secondary | ICD-10-CM | POA: Diagnosis not present

## 2018-08-08 DIAGNOSIS — M85852 Other specified disorders of bone density and structure, left thigh: Secondary | ICD-10-CM | POA: Diagnosis not present

## 2018-08-08 DIAGNOSIS — E2839 Other primary ovarian failure: Secondary | ICD-10-CM

## 2018-08-29 DIAGNOSIS — H524 Presbyopia: Secondary | ICD-10-CM | POA: Diagnosis not present

## 2018-08-29 DIAGNOSIS — H2513 Age-related nuclear cataract, bilateral: Secondary | ICD-10-CM | POA: Diagnosis not present

## 2018-09-07 MED FILL — SHINGRIX 50 MCG SUS: 50 | 1 days supply | Qty: 1 | Fill #0

## 2018-11-01 DIAGNOSIS — M545 Low back pain: Secondary | ICD-10-CM | POA: Diagnosis not present

## 2018-11-14 MED FILL — SHINGRIX 50 MCG SUS: 50 | 1 days supply | Qty: 1 | Fill #1

## 2018-11-23 ENCOUNTER — Other Ambulatory Visit: Payer: Self-pay | Admitting: Family Medicine

## 2018-11-23 ENCOUNTER — Ambulatory Visit
Admission: RE | Admit: 2018-11-23 | Discharge: 2018-11-23 | Disposition: A | Discharge status: Home / Self Care | Payer: PPO | Source: Ambulatory Visit | Attending: Family Medicine | Admitting: Family Medicine

## 2018-11-23 DIAGNOSIS — K6389 Other specified diseases of intestine: Secondary | ICD-10-CM | POA: Diagnosis not present

## 2018-11-23 DIAGNOSIS — R1032 Left lower quadrant pain: Secondary | ICD-10-CM | POA: Diagnosis not present

## 2018-11-23 DIAGNOSIS — R509 Fever, unspecified: Secondary | ICD-10-CM | POA: Diagnosis not present

## 2018-11-23 DIAGNOSIS — N281 Cyst of kidney, acquired: Secondary | ICD-10-CM | POA: Diagnosis not present

## 2018-11-23 DIAGNOSIS — K7689 Other specified diseases of liver: Secondary | ICD-10-CM | POA: Diagnosis not present

## 2018-11-23 MED ORDER — IOPAMIDOL (ISOVUE-300) INJECTION 61%
100.0000 mL | Freq: Once | INTRAVENOUS | Status: AC | PRN
  Administered 2018-11-23: 100 mL via INTRAVENOUS

## 2018-11-23 MED FILL — METRONIDAZOLE 500 MG TABS: 500 | 10 days supply | Qty: 30 | Fill #0

## 2018-11-23 MED FILL — CIPROFLOXACIN HCL 500 MG TA: 500 | 10 days supply | Qty: 20 | Fill #0

## 2018-12-12 MED FILL — SHINGRIX 50 MCG SUS: 50 | 1 days supply | Qty: 1 | Fill #1

## 2019-01-09 DIAGNOSIS — K429 Umbilical hernia without obstruction or gangrene: Secondary | ICD-10-CM | POA: Diagnosis not present

## 2019-02-10 DIAGNOSIS — Z01818 Encounter for other preprocedural examination: Secondary | ICD-10-CM | POA: Diagnosis not present

## 2019-02-14 DIAGNOSIS — K429 Umbilical hernia without obstruction or gangrene: Secondary | ICD-10-CM | POA: Diagnosis not present

## 2019-02-14 MED FILL — traMADol HCL 50 MG TABS: 50 | 5 days supply | Qty: 20 | Fill #0

## 2019-02-21 ENCOUNTER — Other Ambulatory Visit: Payer: PPO

## 2019-03-03 ENCOUNTER — Ambulatory Visit: Payer: PPO

## 2019-03-11 ENCOUNTER — Ambulatory Visit: Payer: PPO

## 2019-03-20 ENCOUNTER — Ambulatory Visit: Payer: PPO

## 2019-04-14 DIAGNOSIS — H6123 Impacted cerumen, bilateral: Secondary | ICD-10-CM | POA: Diagnosis not present

## 2019-04-14 DIAGNOSIS — H903 Sensorineural hearing loss, bilateral: Secondary | ICD-10-CM | POA: Diagnosis not present

## 2019-04-24 DIAGNOSIS — F419 Anxiety disorder, unspecified: Secondary | ICD-10-CM | POA: Diagnosis not present

## 2019-04-24 DIAGNOSIS — Z0000 Encounter for general adult medical examination without abnormal findings: Secondary | ICD-10-CM | POA: Diagnosis not present

## 2019-04-24 DIAGNOSIS — E7800 Pure hypercholesterolemia, unspecified: Secondary | ICD-10-CM | POA: Diagnosis not present

## 2019-04-24 DIAGNOSIS — Z1389 Encounter for screening for other disorder: Secondary | ICD-10-CM | POA: Diagnosis not present

## 2019-05-17 DIAGNOSIS — Z23 Encounter for immunization: Secondary | ICD-10-CM | POA: Diagnosis not present

## 2019-05-30 DIAGNOSIS — D485 Neoplasm of uncertain behavior of skin: Secondary | ICD-10-CM | POA: Diagnosis not present

## 2019-05-30 DIAGNOSIS — L817 Pigmented purpuric dermatosis: Secondary | ICD-10-CM | POA: Diagnosis not present

## 2019-05-30 DIAGNOSIS — L570 Actinic keratosis: Secondary | ICD-10-CM | POA: Diagnosis not present

## 2019-05-30 DIAGNOSIS — C44619 Basal cell carcinoma of skin of left upper limb, including shoulder: Secondary | ICD-10-CM | POA: Diagnosis not present

## 2019-05-30 DIAGNOSIS — L821 Other seborrheic keratosis: Secondary | ICD-10-CM | POA: Diagnosis not present

## 2019-05-30 DIAGNOSIS — L814 Other melanin hyperpigmentation: Secondary | ICD-10-CM | POA: Diagnosis not present

## 2019-05-30 DIAGNOSIS — C44519 Basal cell carcinoma of skin of other part of trunk: Secondary | ICD-10-CM | POA: Diagnosis not present

## 2019-05-30 DIAGNOSIS — Z85828 Personal history of other malignant neoplasm of skin: Secondary | ICD-10-CM | POA: Diagnosis not present

## 2019-05-30 DIAGNOSIS — D1801 Hemangioma of skin and subcutaneous tissue: Secondary | ICD-10-CM | POA: Diagnosis not present

## 2019-05-30 MED FILL — IMIQUIMOD 5% CREAM PACKET: 5 | 30 days supply | Qty: 12 | Fill #0

## 2019-06-27 ENCOUNTER — Other Ambulatory Visit: Payer: Self-pay | Admitting: Family Medicine

## 2019-06-27 DIAGNOSIS — Z1231 Encounter for screening mammogram for malignant neoplasm of breast: Secondary | ICD-10-CM

## 2019-08-01 DIAGNOSIS — H5203 Hypermetropia, bilateral: Secondary | ICD-10-CM | POA: Diagnosis not present

## 2019-08-01 DIAGNOSIS — H52223 Regular astigmatism, bilateral: Secondary | ICD-10-CM | POA: Diagnosis not present

## 2019-08-01 DIAGNOSIS — H524 Presbyopia: Secondary | ICD-10-CM | POA: Diagnosis not present

## 2019-08-01 DIAGNOSIS — H2513 Age-related nuclear cataract, bilateral: Secondary | ICD-10-CM | POA: Diagnosis not present

## 2019-08-03 MED FILL — ALPRAZolam 0.5 MG TABS: 0.5 | 30 days supply | Qty: 30 | Fill #0

## 2019-08-21 ENCOUNTER — Ambulatory Visit: Payer: PPO

## 2019-08-24 ENCOUNTER — Ambulatory Visit
Admission: RE | Admit: 2019-08-24 | Discharge: 2019-08-24 | Disposition: A | Discharge status: Home / Self Care | Payer: PPO | Source: Ambulatory Visit | Attending: Family Medicine | Admitting: Family Medicine

## 2019-08-24 ENCOUNTER — Other Ambulatory Visit: Payer: Self-pay

## 2019-08-24 DIAGNOSIS — Z1231 Encounter for screening mammogram for malignant neoplasm of breast: Secondary | ICD-10-CM

## 2019-09-18 MED FILL — ZOLPIDEM TARTRATE 10 MG TAB: 10 | 30 days supply | Qty: 30 | Fill #0

## 2019-10-11 DIAGNOSIS — H6981 Other specified disorders of Eustachian tube, right ear: Secondary | ICD-10-CM | POA: Diagnosis not present

## 2019-10-13 ENCOUNTER — Other Ambulatory Visit: Payer: PPO

## 2019-11-01 ENCOUNTER — Ambulatory Visit: Payer: PPO | Attending: Internal Medicine

## 2019-11-01 DIAGNOSIS — Z23 Encounter for immunization: Secondary | ICD-10-CM

## 2019-11-01 NOTE — Progress Notes (Signed)
   Covid-19 Vaccination Clinic  Name:  Azaylah Stailey    MRN: [id number] DOB: 01-07-1956  11/01/2019  Ms. Pevey was observed post Covid-19 immunization for 15 minutes without incident. She was provided with Vaccine Information Sheet and instruction to access the V-Safe system.   Ms. Heming was instructed to call 911 with any severe reactions post vaccine: Marland Kitchen Difficulty breathing  . Swelling of face and throat  . A fast heartbeat  . A bad rash all over body  . Dizziness and weakness

## 2019-11-03 ENCOUNTER — Other Ambulatory Visit (HOSPITAL_COMMUNITY): Payer: Self-pay | Admitting: Internal Medicine

## 2020-02-17 ENCOUNTER — Other Ambulatory Visit (HOSPITAL_COMMUNITY)
Admission: RE | Admit: 2020-02-17 | Discharge: 2020-02-17 | Disposition: A | Discharge status: Home / Self Care | Payer: PPO | Source: Ambulatory Visit | Attending: General Surgery | Admitting: General Surgery

## 2020-02-17 DIAGNOSIS — Z20822 Contact with and (suspected) exposure to covid-19: Secondary | ICD-10-CM | POA: Insufficient documentation

## 2020-02-17 LAB — SARS CORONAVIRUS 2 BY RT PCR (HOSPITAL ORDER, PERFORMED IN ~~LOC~~ HOSPITAL LAB): SARS Coronavirus 2: NEGATIVE

## 2020-02-17 NOTE — Progress Notes (Signed)
Dr. Hulen Skains notified of COVID test results.

## 2020-02-20 ENCOUNTER — Other Ambulatory Visit (HOSPITAL_COMMUNITY)
Admission: RE | Admit: 2020-02-20 | Discharge: 2020-02-20 | Disposition: A | Discharge status: Home / Self Care | Payer: PPO | Source: Ambulatory Visit | Attending: General Surgery | Admitting: General Surgery

## 2020-02-20 DIAGNOSIS — Z01818 Encounter for other preprocedural examination: Secondary | ICD-10-CM | POA: Insufficient documentation

## 2020-02-20 DIAGNOSIS — Z20822 Contact with and (suspected) exposure to covid-19: Secondary | ICD-10-CM | POA: Insufficient documentation

## 2020-02-20 LAB — SARS CORONAVIRUS 2 BY RT PCR (HOSPITAL ORDER, PERFORMED IN ~~LOC~~ HOSPITAL LAB): SARS Coronavirus 2: NEGATIVE

## 2020-03-12 ENCOUNTER — Other Ambulatory Visit (HOSPITAL_COMMUNITY)
Admission: RE | Admit: 2020-03-12 | Discharge: 2020-03-12 | Disposition: A | Discharge status: Home / Self Care | Payer: PPO | Source: Ambulatory Visit | Attending: General Surgery | Admitting: General Surgery

## 2020-03-12 DIAGNOSIS — Z20822 Contact with and (suspected) exposure to covid-19: Secondary | ICD-10-CM | POA: Diagnosis not present

## 2020-03-12 DIAGNOSIS — Z01812 Encounter for preprocedural laboratory examination: Secondary | ICD-10-CM | POA: Insufficient documentation

## 2020-03-12 LAB — SARS CORONAVIRUS 2 BY RT PCR (HOSPITAL ORDER, PERFORMED IN ~~LOC~~ HOSPITAL LAB): SARS Coronavirus 2: NEGATIVE

## 2020-05-09 ENCOUNTER — Other Ambulatory Visit: Payer: Self-pay | Admitting: Family Medicine

## 2020-05-09 ENCOUNTER — Other Ambulatory Visit (HOSPITAL_COMMUNITY)
Admission: RE | Admit: 2020-05-09 | Discharge: 2020-05-09 | Disposition: A | Discharge status: Home / Self Care | Payer: PPO | Source: Ambulatory Visit | Attending: Family Medicine | Admitting: Family Medicine

## 2020-05-09 DIAGNOSIS — Z124 Encounter for screening for malignant neoplasm of cervix: Secondary | ICD-10-CM | POA: Insufficient documentation

## 2020-05-09 DIAGNOSIS — Z1389 Encounter for screening for other disorder: Secondary | ICD-10-CM | POA: Diagnosis not present

## 2020-05-09 DIAGNOSIS — Z0000 Encounter for general adult medical examination without abnormal findings: Secondary | ICD-10-CM | POA: Diagnosis not present

## 2020-05-09 DIAGNOSIS — Z1151 Encounter for screening for human papillomavirus (HPV): Secondary | ICD-10-CM | POA: Diagnosis not present

## 2020-05-09 DIAGNOSIS — Z1159 Encounter for screening for other viral diseases: Secondary | ICD-10-CM | POA: Diagnosis not present

## 2020-05-09 DIAGNOSIS — E7800 Pure hypercholesterolemia, unspecified: Secondary | ICD-10-CM | POA: Diagnosis not present

## 2020-05-09 DIAGNOSIS — G4700 Insomnia, unspecified: Secondary | ICD-10-CM | POA: Diagnosis not present

## 2020-05-10 LAB — CYTOLOGY - PAP
Comment: NEGATIVE
Diagnosis: NEGATIVE
High risk HPV: NEGATIVE

## 2020-05-22 DIAGNOSIS — H6983 Other specified disorders of Eustachian tube, bilateral: Secondary | ICD-10-CM | POA: Diagnosis not present

## 2020-05-30 DIAGNOSIS — Z85828 Personal history of other malignant neoplasm of skin: Secondary | ICD-10-CM | POA: Diagnosis not present

## 2020-05-30 DIAGNOSIS — D2261 Melanocytic nevi of right upper limb, including shoulder: Secondary | ICD-10-CM | POA: Diagnosis not present

## 2020-05-30 DIAGNOSIS — D225 Melanocytic nevi of trunk: Secondary | ICD-10-CM | POA: Diagnosis not present

## 2020-05-30 DIAGNOSIS — D1801 Hemangioma of skin and subcutaneous tissue: Secondary | ICD-10-CM | POA: Diagnosis not present

## 2020-05-30 DIAGNOSIS — L814 Other melanin hyperpigmentation: Secondary | ICD-10-CM | POA: Diagnosis not present

## 2020-05-30 DIAGNOSIS — L820 Inflamed seborrheic keratosis: Secondary | ICD-10-CM | POA: Diagnosis not present

## 2020-05-30 DIAGNOSIS — D2262 Melanocytic nevi of left upper limb, including shoulder: Secondary | ICD-10-CM | POA: Diagnosis not present

## 2020-05-30 DIAGNOSIS — L821 Other seborrheic keratosis: Secondary | ICD-10-CM | POA: Diagnosis not present

## 2020-05-30 DIAGNOSIS — D2271 Melanocytic nevi of right lower limb, including hip: Secondary | ICD-10-CM | POA: Diagnosis not present

## 2020-05-30 DIAGNOSIS — D485 Neoplasm of uncertain behavior of skin: Secondary | ICD-10-CM | POA: Diagnosis not present

## 2020-06-19 ENCOUNTER — Other Ambulatory Visit (HOSPITAL_BASED_OUTPATIENT_CLINIC_OR_DEPARTMENT_OTHER): Payer: Self-pay

## 2020-06-19 ENCOUNTER — Ambulatory Visit: Payer: PPO | Attending: Internal Medicine

## 2020-06-19 ENCOUNTER — Other Ambulatory Visit: Payer: Self-pay

## 2020-06-19 DIAGNOSIS — Z23 Encounter for immunization: Secondary | ICD-10-CM

## 2020-06-19 MED ORDER — PFIZER-BIONT COVID-19 VAC-TRIS 30 MCG/0.3ML IM SUSP
INTRAMUSCULAR | 0 refills | Status: AC
  Filled 2020-06-19: qty 0.3, 1d supply, fill #0

## 2020-06-19 NOTE — Progress Notes (Signed)
   Covid-19 Vaccination Clinic  Name:  Arden Axon    MRN: [id number] DOB: 07-Oct-1955  06/19/2020  Ms. Bachand was observed post Covid-19 immunization for 15 minutes without incident. She was provided with Vaccine Information Sheet and instruction to access the V-Safe system.   Ms. Riede was instructed to call 911 with any severe reactions post vaccine: Marland Kitchen Difficulty breathing  . Swelling of face and throat  . A fast heartbeat  . A bad rash all over body  . Dizziness and weakness   Immunizations Administered    Name Date Dose VIS Date Route   PFIZER Comrnaty(Gray TOP) Covid-19 Vaccine 06/19/2020 11:33 AM 0.3 mL 01/11/2020 Intramuscular   Manufacturer: [place]   Lot: [id number]   NDC: [id number]

## 2020-07-04 IMAGING — CT CT ABD-PELV W/ CM
2 of 5 series · 13 of 46 positions shown, 15 images · IV contrast (iopamidol)
Comparison: CT 05/19/2018

CLINICAL DATA: Left lower quadrant pain and constipation

EXAM:
CT ABDOMEN AND PELVIS WITH CONTRAST
TECHNIQUE: Multidetector CT imaging of the abdomen and pelvis was performed
using the standard protocol following bolus administration of
intravenous contrast.
Creatinine was obtained on site at [HOSPITAL] at [REDACTED].
Results: Creatinine 0.7 mg/dL.
CONTRAST:  100mL VZ09VD-VNN IOPAMIDOL (VZ09VD-VNN) INJECTION 61%

[Series 2: abd pelvis 5.00 br40 s3 axial (person_name) · axial · 0.50mm/px · z∈[+1210,+1550]mm · 10 of 82 slices shown, 12 images]
[im 7/82  soft-tissue]
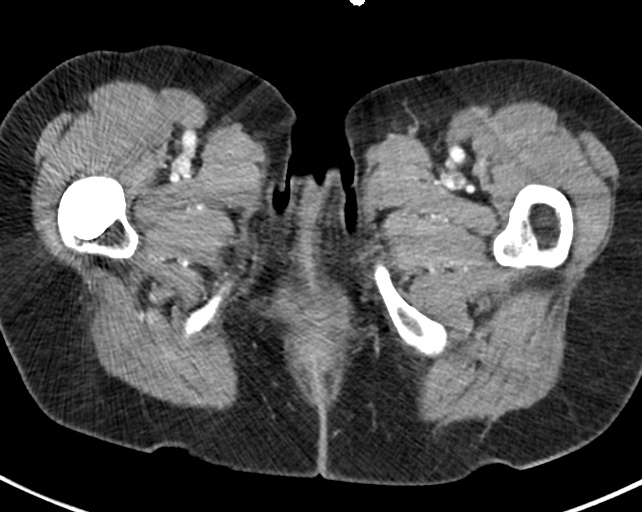
[im 7/82  bone]
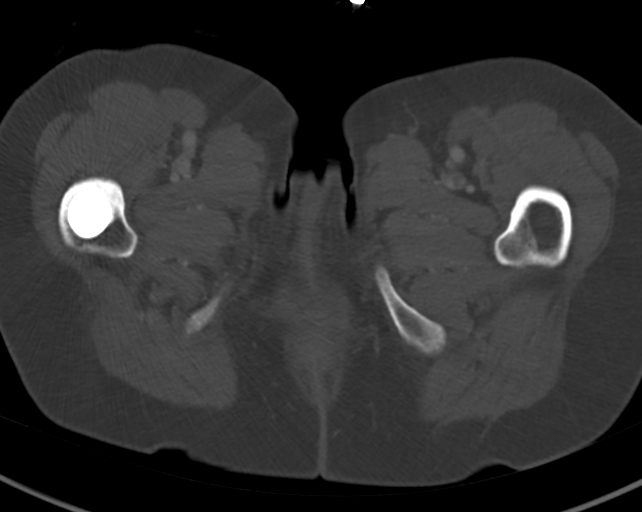
[im 13/82  soft-tissue]
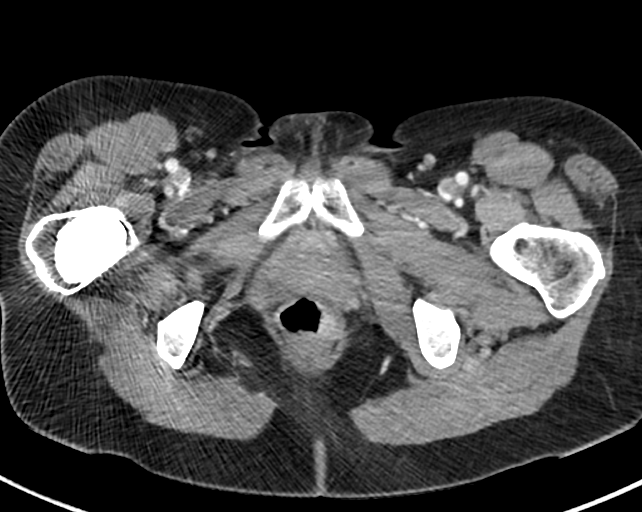
[im 25/82  soft-tissue]
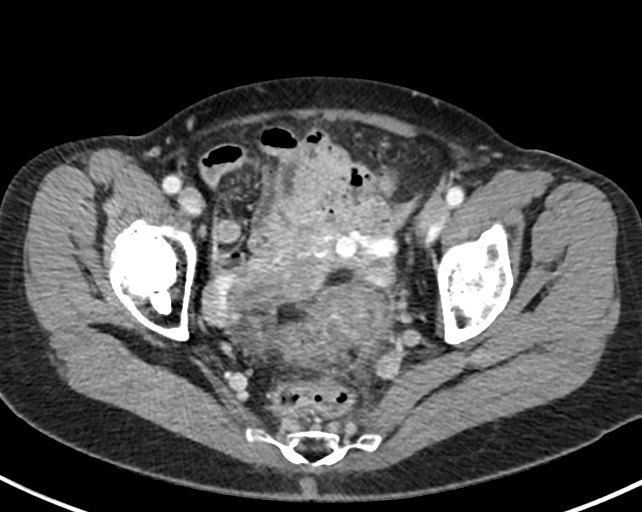
[im 32/82  soft-tissue]
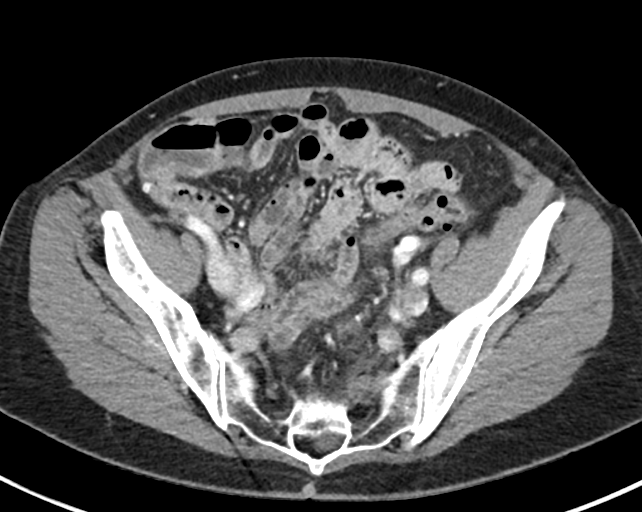
[im 38/82  soft-tissue]
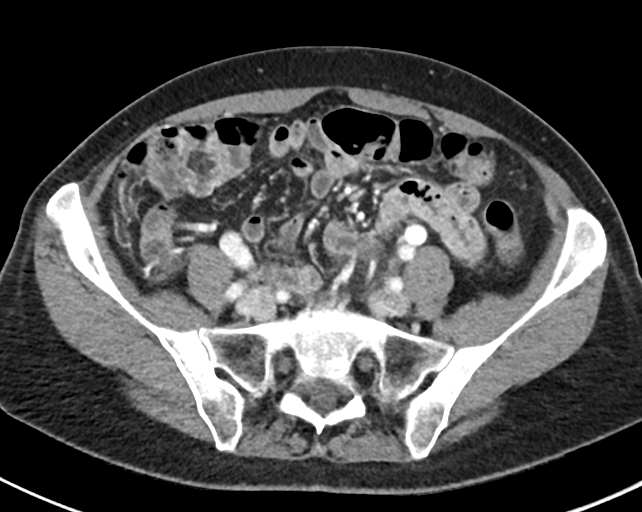
[im 44/82  soft-tissue]
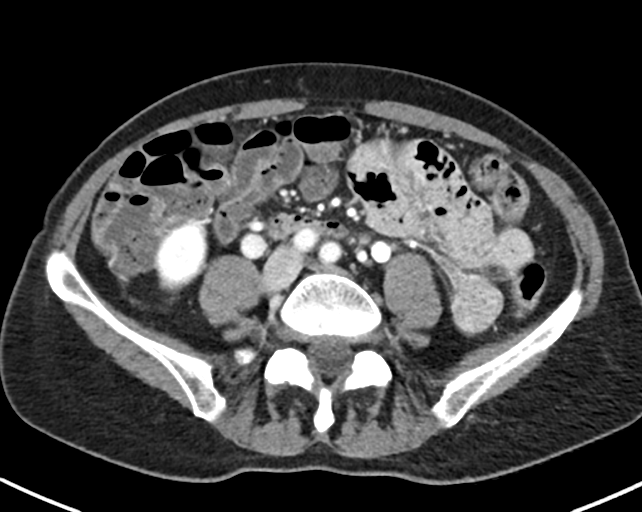
[im 50/82  soft-tissue]
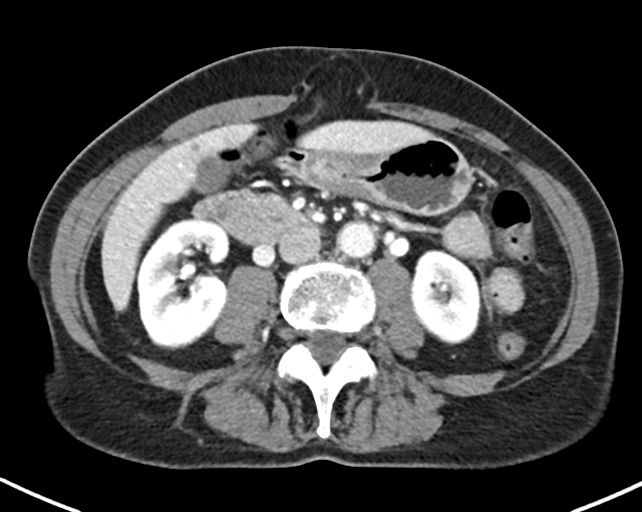
[im 63/82  soft-tissue]
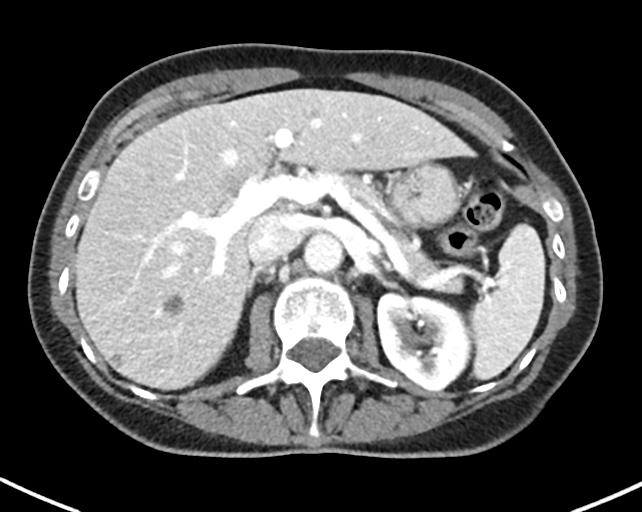
[im 69/82  soft-tissue]
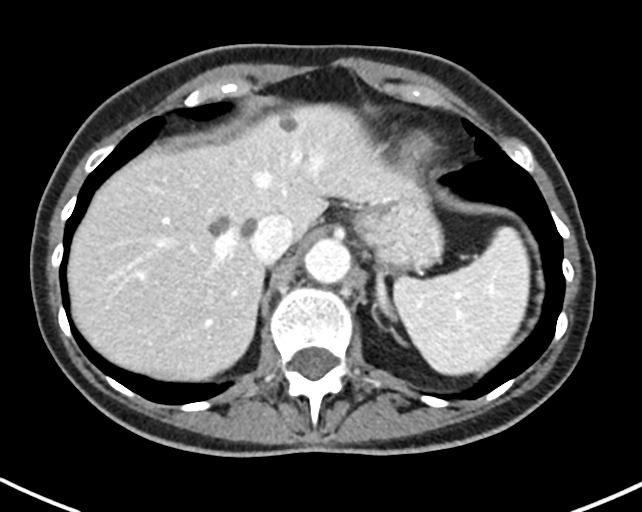
[im 69/82  bone]
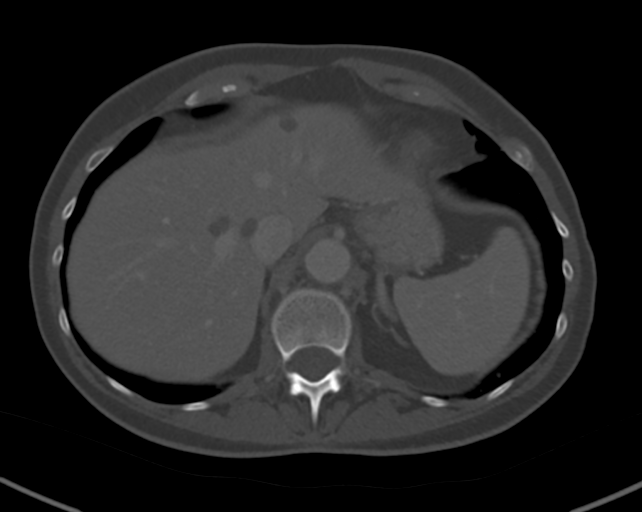
[im 75/82  soft-tissue]
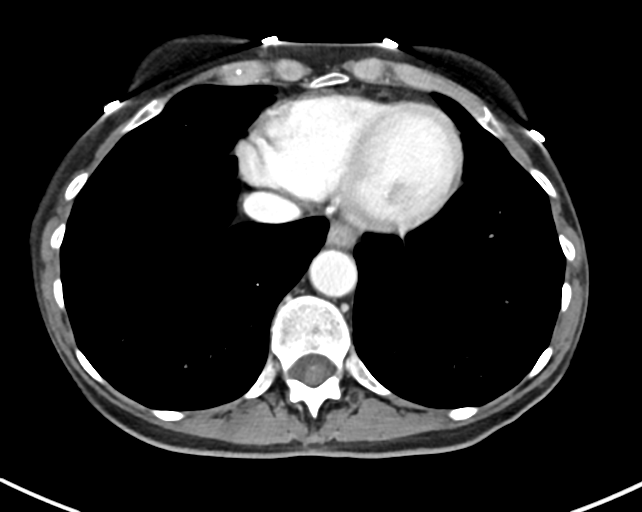

[Series 6: abd pelvis 2.00 br40 s3 cor · coronal · 0.63mm/px · 3 of 128 slices shown]
[im 43/128  soft-tissue]
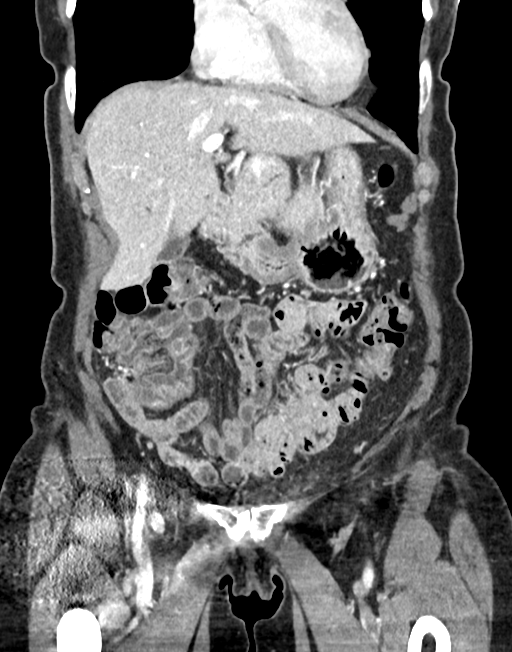
[im 57/128  soft-tissue]
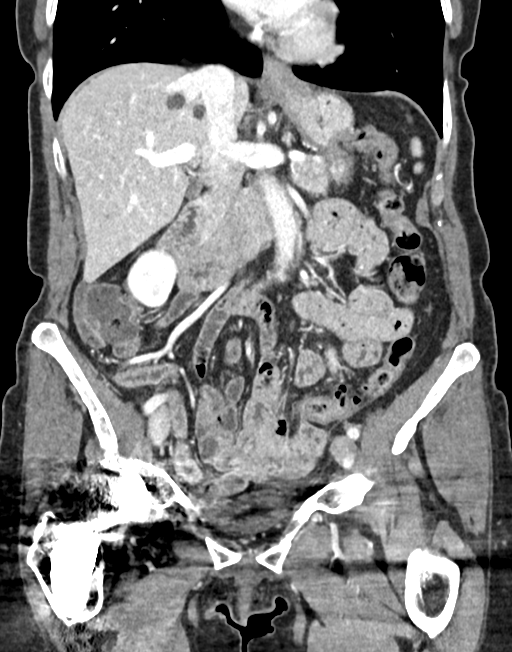
[im 71/128  soft-tissue]
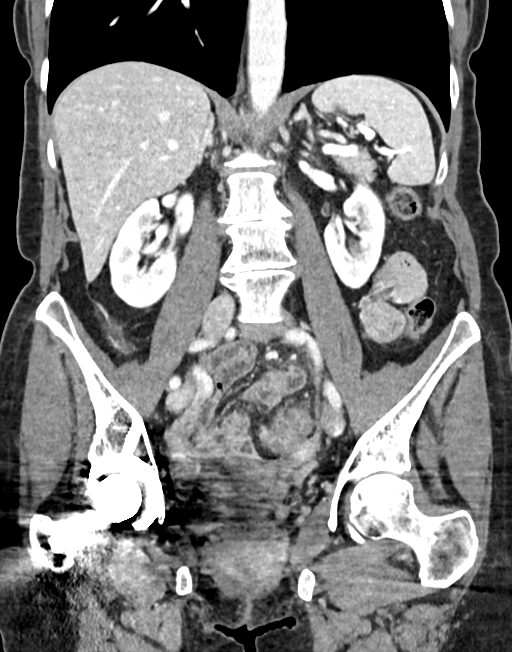

[13 of 46 positions shown; findings below may reference images not displayed]

FINDINGS: Lower chest: Lung bases demonstrate no acute consolidation or
pleural effusion. The heart size is normal.

Hepatobiliary: Multiple hepatic cysts. No calcified gallstone or
biliary dilatation

Pancreas: Unremarkable. No pancreatic ductal dilatation or
surrounding inflammatory changes.

Spleen: Normal in size without focal abnormality.

Adrenals/Urinary Tract: Adrenal glands are normal. No
hydronephrosis. Cyst in the mid to upper right kidney. The bladder
is normal.

Stomach/Bowel: Stomach nonenlarged. No dilated small bowel. Status
post appendectomy.

Focal wall thickening with inflammatory changes at the distal
sigmoid colon. Diverticular disease is present. No extraluminal gas
or fluid collection

Vascular/Lymphatic: Nonaneurysmal aorta.  No significant adenopathy

Reproductive: Partially obscured by artifact. No adnexal mass. The
uterus is not well visualized.

Other: No free air. No significant pelvic effusion. New moderate fat
containing supraumbilical ventral hernia.

Musculoskeletal: Degenerative changes. No acute or suspicious
osseous lesion. Right hip replacement
IMPRESSION: 1. Focal wall thickening with inflammatory change at the sigmoid
colon, suspect for acute diverticulitis. No perforation or abscess.
2. Multiple hepatic cysts.  Right kidney cyst.
3. New moderate fat containing periumbilical ventral hernia

## 2020-07-08 DIAGNOSIS — D72819 Decreased white blood cell count, unspecified: Secondary | ICD-10-CM | POA: Diagnosis not present

## 2020-08-08 ENCOUNTER — Other Ambulatory Visit: Payer: Self-pay | Admitting: Family Medicine

## 2020-08-08 DIAGNOSIS — Z1231 Encounter for screening mammogram for malignant neoplasm of breast: Secondary | ICD-10-CM

## 2020-09-13 ENCOUNTER — Other Ambulatory Visit (HOSPITAL_COMMUNITY): Payer: Self-pay

## 2020-09-13 MED ORDER — AZITHROMYCIN 500 MG PO TABS
ORAL_TABLET | ORAL | 0 refills | Status: AC
  Filled 2020-09-13: qty 4, 3d supply, fill #0

## 2020-09-16 ENCOUNTER — Other Ambulatory Visit (HOSPITAL_COMMUNITY): Payer: Self-pay

## 2020-09-16 MED ORDER — ATOVAQUONE-PROGUANIL HCL 250-100 MG PO TABS
ORAL_TABLET | ORAL | 0 refills | Status: AC
  Filled 2020-09-16: qty 28, 28d supply, fill #0

## 2020-09-17 ENCOUNTER — Other Ambulatory Visit (HOSPITAL_COMMUNITY): Payer: Self-pay

## 2020-10-08 ENCOUNTER — Other Ambulatory Visit: Payer: Self-pay

## 2020-10-08 ENCOUNTER — Ambulatory Visit
Admission: RE | Admit: 2020-10-08 | Discharge: 2020-10-08 | Disposition: A | Discharge status: Home / Self Care | Payer: PPO | Source: Ambulatory Visit | Attending: Family Medicine | Admitting: Family Medicine

## 2020-10-08 DIAGNOSIS — Z1231 Encounter for screening mammogram for malignant neoplasm of breast: Secondary | ICD-10-CM

## 2020-10-30 DIAGNOSIS — Z20822 Contact with and (suspected) exposure to covid-19: Secondary | ICD-10-CM | POA: Diagnosis not present

## 2020-10-31 DIAGNOSIS — Z20822 Contact with and (suspected) exposure to covid-19: Secondary | ICD-10-CM | POA: Diagnosis not present

## 2020-11-18 ENCOUNTER — Other Ambulatory Visit (HOSPITAL_BASED_OUTPATIENT_CLINIC_OR_DEPARTMENT_OTHER): Payer: Self-pay

## 2020-11-18 ENCOUNTER — Ambulatory Visit: Payer: PPO

## 2020-11-18 MED ORDER — FLUAD QUADRIVALENT 0.5 ML IM PRSY
PREFILLED_SYRINGE | INTRAMUSCULAR | 0 refills | Status: AC
  Filled 2020-11-18: qty 0.5, 1d supply, fill #0

## 2021-01-15 ENCOUNTER — Ambulatory Visit: Payer: PPO | Attending: Internal Medicine

## 2021-01-15 ENCOUNTER — Other Ambulatory Visit (HOSPITAL_BASED_OUTPATIENT_CLINIC_OR_DEPARTMENT_OTHER): Payer: Self-pay

## 2021-01-15 DIAGNOSIS — Z23 Encounter for immunization: Secondary | ICD-10-CM

## 2021-01-15 MED ORDER — PFIZER COVID-19 VAC BIVALENT 30 MCG/0.3ML IM SUSP
INTRAMUSCULAR | 0 refills | Status: AC
  Filled 2021-01-15: qty 0.3, 1d supply, fill #0

## 2021-01-15 NOTE — Progress Notes (Signed)
° °  Covid-19 Vaccination Clinic  Name:  Avaley Coop    MRN: [id number] DOB: October 06, 1955  01/15/2021  Ms. Washer was observed post Covid-19 immunization for 15 minutes without incident. She was provided with Vaccine Information Sheet and instruction to access the V-Safe system.   Ms. Hepworth was instructed to call 911 with any severe reactions post vaccine: Difficulty breathing  Swelling of face and throat  A fast heartbeat  A bad rash all over body  Dizziness and weakness   Immunizations Administered     Name Date Dose VIS Date Route   Pfizer Covid-19 Vaccine Bivalent Booster 01/15/2021 10:57 AM 0.3 mL 10/02/2020 Intramuscular   Manufacturer: [place]   Lot: [id number]   [place]: [phone]

## 2021-05-29 ENCOUNTER — Ambulatory Visit: Payer: PPO | Attending: Internal Medicine

## 2021-05-29 ENCOUNTER — Other Ambulatory Visit (HOSPITAL_BASED_OUTPATIENT_CLINIC_OR_DEPARTMENT_OTHER): Payer: Self-pay

## 2021-05-29 DIAGNOSIS — R030 Elevated blood-pressure reading, without diagnosis of hypertension: Secondary | ICD-10-CM | POA: Diagnosis not present

## 2021-05-29 DIAGNOSIS — Z1331 Encounter for screening for depression: Secondary | ICD-10-CM | POA: Diagnosis not present

## 2021-05-29 DIAGNOSIS — M2550 Pain in unspecified joint: Secondary | ICD-10-CM | POA: Diagnosis not present

## 2021-05-29 DIAGNOSIS — Z0000 Encounter for general adult medical examination without abnormal findings: Secondary | ICD-10-CM | POA: Diagnosis not present

## 2021-05-29 DIAGNOSIS — G4700 Insomnia, unspecified: Secondary | ICD-10-CM | POA: Diagnosis not present

## 2021-05-29 DIAGNOSIS — Z23 Encounter for immunization: Secondary | ICD-10-CM

## 2021-05-29 DIAGNOSIS — Z1211 Encounter for screening for malignant neoplasm of colon: Secondary | ICD-10-CM | POA: Diagnosis not present

## 2021-05-29 DIAGNOSIS — E7800 Pure hypercholesterolemia, unspecified: Secondary | ICD-10-CM | POA: Diagnosis not present

## 2021-05-29 MED ORDER — PFIZER COVID-19 VAC BIVALENT 30 MCG/0.3ML IM SUSP
INTRAMUSCULAR | 0 refills | Status: AC
Start: 2021-05-29 — End: ?
  Filled 2021-05-29: qty 0.3, 1d supply, fill #0

## 2021-05-29 NOTE — Progress Notes (Signed)
? ?  Covid-19 Vaccination Clinic ? ?Name:  Melissa [name]    ?MRN: [id number] ?DOB: Jun 13, 1955 ? ?05/29/2021 ? ?[name] was observed post Covid-19 immunization for 15 minutes without incident. She was provided with Vaccine Information Sheet and instruction to access the V-Safe system.  ? ?[name] was instructed to call 911 with any severe reactions post vaccine: ?Difficulty breathing  ?Swelling of face and throat  ?A fast heartbeat  ?A bad rash all over body  ?Dizziness and weakness  ? ?Immunizations Administered   ? ? Name Date Dose VIS Date Route  ? Ambulance person Booster 05/29/2021 11:38 AM 0.3 mL 10/02/2020 Intramuscular  ? Manufacturer: [place]: [id number]  ? [place]: [id number]  ? ?  ? ?

## 2021-06-03 DIAGNOSIS — L821 Other seborrheic keratosis: Secondary | ICD-10-CM | POA: Diagnosis not present

## 2021-06-03 DIAGNOSIS — L820 Inflamed seborrheic keratosis: Secondary | ICD-10-CM | POA: Diagnosis not present

## 2021-06-03 DIAGNOSIS — L905 Scar conditions and fibrosis of skin: Secondary | ICD-10-CM | POA: Diagnosis not present

## 2021-06-03 DIAGNOSIS — L570 Actinic keratosis: Secondary | ICD-10-CM | POA: Diagnosis not present

## 2021-06-03 DIAGNOSIS — L309 Dermatitis, unspecified: Secondary | ICD-10-CM | POA: Diagnosis not present

## 2021-06-03 DIAGNOSIS — D1801 Hemangioma of skin and subcutaneous tissue: Secondary | ICD-10-CM | POA: Diagnosis not present

## 2021-06-03 DIAGNOSIS — L814 Other melanin hyperpigmentation: Secondary | ICD-10-CM | POA: Diagnosis not present

## 2021-06-03 DIAGNOSIS — Z85828 Personal history of other malignant neoplasm of skin: Secondary | ICD-10-CM | POA: Diagnosis not present

## 2021-09-01 ENCOUNTER — Other Ambulatory Visit: Payer: Self-pay | Admitting: Family Medicine

## 2021-09-01 DIAGNOSIS — Z1231 Encounter for screening mammogram for malignant neoplasm of breast: Secondary | ICD-10-CM

## 2021-10-23 ENCOUNTER — Ambulatory Visit
Admission: RE | Admit: 2021-10-23 | Discharge: 2021-10-23 | Disposition: A | Discharge status: Home / Self Care | Payer: PPO | Source: Ambulatory Visit | Attending: Family Medicine | Admitting: Family Medicine

## 2021-10-23 DIAGNOSIS — Z1231 Encounter for screening mammogram for malignant neoplasm of breast: Secondary | ICD-10-CM | POA: Diagnosis not present

## 2021-10-30 ENCOUNTER — Other Ambulatory Visit: Payer: Self-pay | Admitting: Family Medicine

## 2021-10-30 ENCOUNTER — Ambulatory Visit
Admission: RE | Admit: 2021-10-30 | Discharge: 2021-10-30 | Disposition: A | Discharge status: Home / Self Care | Payer: PPO | Source: Ambulatory Visit | Attending: Family Medicine | Admitting: Family Medicine

## 2021-10-30 DIAGNOSIS — M25461 Effusion, right knee: Secondary | ICD-10-CM

## 2021-10-30 DIAGNOSIS — M1711 Unilateral primary osteoarthritis, right knee: Secondary | ICD-10-CM | POA: Diagnosis not present

## 2021-10-30 DIAGNOSIS — Z23 Encounter for immunization: Secondary | ICD-10-CM | POA: Diagnosis not present

## 2021-11-12 ENCOUNTER — Other Ambulatory Visit (HOSPITAL_BASED_OUTPATIENT_CLINIC_OR_DEPARTMENT_OTHER): Payer: Self-pay

## 2021-11-12 MED ORDER — COVID-19 MRNA 2023-2024 VACCINE (COMIRNATY) 0.3 ML INJECTION
INTRAMUSCULAR | 0 refills | Status: AC
  Filled 2021-11-12: qty 0.3, 1d supply, fill #0

## 2022-02-02 ENCOUNTER — Other Ambulatory Visit (HOSPITAL_COMMUNITY): Payer: Self-pay

## 2022-02-02 MED ORDER — ZOLPIDEM TARTRATE 10 MG PO TABS
5.0000 mg | ORAL_TABLET | Freq: Every evening | ORAL | 0 refills | Status: AC | PRN
  Filled 2022-02-02: qty 30, 60d supply, fill #0

## 2022-02-02 MED ORDER — ALPRAZOLAM 0.5 MG PO TABS
0.2500 mg | ORAL_TABLET | Freq: Every day | ORAL | 0 refills | Status: AC | PRN
  Filled 2022-02-02: qty 30, 30d supply, fill #0

## 2022-02-03 ENCOUNTER — Other Ambulatory Visit (HOSPITAL_COMMUNITY): Payer: Self-pay

## 2022-03-12 ENCOUNTER — Ambulatory Visit (INDEPENDENT_AMBULATORY_CARE_PROVIDER_SITE_OTHER): Payer: PPO | Admitting: Psychology

## 2022-03-12 DIAGNOSIS — F4323 Adjustment disorder with mixed anxiety and depressed mood: Secondary | ICD-10-CM | POA: Diagnosis not present

## 2022-03-12 NOTE — Progress Notes (Signed)
[facility] Initial Adult Intake  Name: Melissa [name] Date: 03/12/2022 MRN: [id number] DOB: [birth year] PCP: Carol Ada, MD  Time spent: 11:00 - 11:59 AM  Guardian/Payee:  Self   Paperwork requested: Yes   Today I met with Melissa [name] for in office face-to-face individual psychotherapy.  Reason for Visit /Presenting Problem:  Melissa [name] is a 67 year old MWF who was referred by her PCP.  At Christmas there was an event involving her daughter that precipitated a family "blow up."  They've only seen one another once since then.  Melissa [name] has been finding herself getting irritable and "short" with her husband and she thinks it's because she is so upset about the situation with her daughter.  Melissa [name] is struggling with what to do and is fearful that she will never be able to have a relationship with her daughter.     Mental Status Exam: Appearance:   Casual and Fairly Groomed     Behavior:  Appropriate  Motor:  Normal  Speech/Language:   NA  Affect:  Appropriate  Mood:  anxious, depressed, and irritable  Thought process:  normal  Thought content:    WNL  Sensory/Perceptual disturbances:    WNL  Orientation:  oriented to person, place, time/date, and situation  Attention:  [name]  Concentration:  [name]  Memory:  Melissa of knowledge:   [name]  Insight:    [name]  Judgment:   [name]  Impulse Control:  [name]    Reported Symptoms:   GAD-7= 6 (Mild Anxiety) feels nervous, worries about different things, becomes irritable and has hard time relaxing.  PHQ-9= 9 (Mild Depression) low mood states, feels hopeless, loss of interest, sleep disruption (continuous), poor concentration, feels bad about herself  Risk Assessment: Danger to Self:  No Self-injurious Behavior: No Danger to Others: No Duty to Warn:no Physical Aggression / Violence:No  Access to Firearms a concern: No   Substance Abuse History: Current substance abuse: No     Past Psychiatric History:    No previous psychological problems have been observed Outpatient Providers: History of Psych Hospitalization: No  Psychological Testing:  n/a    Abuse History:  Victim of: No.,  n/a    Report needed: No. Victim of Neglect:No. Perpetrator of  n/a   Witness / Exposure to Domestic Violence: No   Protective Services Involvement: No  Witness to [employer] Violence:  No   Family History:  Family History  Problem Relation Age of Onset   Breast cancer Mother     Living situation: the patient lives with their spouse  Sexual Orientation: Straight  Relationship Status: married  Name of spouse / other: If a parent, number of children / ages:  Daughter: Melissa [name] (52) lives in [place], [place] with her son-in-law Melissa [name].  They have been married for 20 years and they have no real relationship with his parents who live in [place].  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 Grandchildren - Maddie (16), Carter (13) and Hannah (10).    Son: Melissa [name] (33) her lives in [place], has a serious girlfriend, has a better relationship now but was difficult during his college years and was in therapy with Melissa [name].  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 History of legal issue / charges:  n/a  Medical  History/Surgical History: reviewed Past Medical History:  Diagnosis Date   Complication of anesthesia    PONV (postoperative nausea and vomiting)     Past Surgical History:  Procedure Laterality Date   HIP ARTHROPLASTY     Right   LAPAROSCOPIC APPENDECTOMY N/A 05/19/2018   Procedure: APPENDECTOMY LAPAROSCOPIC;  Surgeon: Ralene Ok, MD;  Location: [facility] OR;  Service: General;  Laterality: N/A;    Medications: Current Outpatient Medications  Medication Sig Dispense Refill   acetaminophen (TYLENOL) 500 MG tablet Take 1 tablet (500 mg total) by mouth every 4 (four) hours as needed for mild pain. 30 tablet 0   ALPRAZolam (XANAX) 0.5 MG tablet Take 0.5 mg by mouth at bedtime as needed for anxiety.     ALPRAZolam (XANAX) 0.5 MG tablet Take 0.5-1 tablets (0.25-0.5 mg total) by mouth daily as needed for anxiety. 30 tablet 0   atovaquone-proguanil (MALARONE) 250-100 MG TABS tablet Take 1 tablet by mouth once daily begin 2 days prearrival, take daily during stay, and continue for 7 days posttravel for malaria prevention 28 tablet 0   azithromycin (ZITHROMAX) 500 MG tablet For Non-bloody diarrhea take 2 tablets by mouth on day 1. If resolved after first dose stop. If persists take 1 tablet  daily on days 2 and 3. For bloody diarrhea take 2 on day one, then 1 on days 2 and 3. 4 tablet 0   COVID-19 mRNA bivalent vaccine, Pfizer, (PFIZER COVID-19 VAC BIVALENT) injection Inject into the muscle. 0.3 mL 0   COVID-19 mRNA Vac-TriS, Pfizer, (PFIZER-BIONT COVID-19 VAC-TRIS) SUSP injection Inject into the muscle. 0.3 mL 0   COVID-19 mRNA vaccine 2023-2024 (COMIRNATY) SUSP injection Inject into the muscle. 0.3 mL 0   COVID-19 mRNA bivalent vaccine, Pfizer, (PFIZER COVID-19 VAC BIVALENT) injection Inject into the muscle. 0.3 mL 0   influenza vaccine adjuvanted (FLUAD QUADRIVALENT) 0.5 ML injection Inject into the muscle. 0.5 mL 0   zolpidem (AMBIEN) 10 MG tablet Take 5 mg by mouth at bedtime as needed for  sleep.     zolpidem (AMBIEN) 10 MG tablet Take 0.5 tablets (5 mg total) by mouth at bedtime as needed. (use occasionally) 30 tablet 0   No current facility-administered medications for this visit.    Allergies  Allergen Reactions   Penicillins Rash    Did it involve swelling of the face/tongue/throat, SOB, or low BP? No Did it involve sudden or severe rash/hives, skin peeling, or any reaction on the inside of your mouth or nose? Yes Did you need to seek medical attention at a hospital or doctor's office? No When did it last happen? unknown If all above answers are "NO", may proceed with cephalosporin use.     Diagnoses:  Adjustment Disorder with mixed anxiety and depressed mood   Plan of Care: Melissa [name] is a 67 y.o. MWF who comes referred by her PCP for anxiety.  Based on interview and mood screenings, it is evident that patient suffers from both anxiety and depression as primarily related to family conflict with her adult daughter.  Patient feels hopeless  about repairing the relationship and does know what to do.  It is believed that Melissa [name] would benefit from a weekly individual psychotherapy which would focus on providing support around issues related to family conflict, dynamics, communication and conflict resolution. Treatment would work to build positive coping strategies and skills which would allow patient to manage mood states more effectively as well as prevent relapse. Psychotherapy will also focus on personal past experiences as well as those with her daughter in order to help patient better understand family dynamics and arrive either possible resolution or acceptance of what their relationship has become.  Therapist has also identified the need for medication and follow up with PCP for medication evaluation.  Patient frequently made reference to "forgetting" which warrants further investigation and possible evaluation.  Therapist will continue to monitor and investigate issues  related to cognitive health.   Melissa Crochet, PhD

## 2022-03-17 ENCOUNTER — Ambulatory Visit (INDEPENDENT_AMBULATORY_CARE_PROVIDER_SITE_OTHER): Payer: PPO | Admitting: Psychology

## 2022-03-17 DIAGNOSIS — F4323 Adjustment disorder with mixed anxiety and depressed mood: Secondary | ICD-10-CM | POA: Diagnosis not present

## 2022-03-17 NOTE — Progress Notes (Signed)
PROGRESS NOTE: Treatment Plan  Name: Melissa [name] Date: 03/17/2022 MRN: [id number] DOB: [birth year] PCP: Carol Ada, MD  Time spent: 3:00 - 3:59 PM   Today I met with  Melissa [name] in remote video (WebEx) face-to-face individual psychotherapy.  Distance Site: Client's Home Orginating Site: Dr Jannifer Franklin Remote Office Consent: Obtained verbal consent to transmit  session remotely    Reason for Visit /Presenting Problem:  Melissa [name] is a 67 year old MWF who was referred by her PCP.  At Christmas there was an event involving her daughter that precipitated a family "blow up."  They've only seen one another once since then.  Melissa [name] has been finding herself getting irritable and "short" with her husband and she thinks it's because she is so upset about the situation with her daughter.  Melissa [name] is struggling with what to do and is fearful that she will never be able to have a relationship with her daughter.     Mental Status Exam: Appearance:   Casual and Fairly Groomed     Behavior:  Appropriate  Motor:  Normal  Speech/Language:   NA  Affect:  Appropriate  Mood:  anxious, depressed, and irritable  Thought process:  normal  Thought content:    WNL  Sensory/Perceptual disturbances:    WNL  Orientation:  oriented to person, place, time/date, and situation  Attention:  Good  Concentration:  Good  Memory:  [place] of knowledge:   Good  Insight:    Good  Judgment:   Good  Impulse Control:  Good     Individualized Treatment Plan       Strengths: kind, helpful, cheerful, resourceful  Supports: Husband, son and friends   Goal/Needs for Treatment:  In order of importance to patient 1) Work on repairing relationship with daughter through the use of better communication and conflict management  skills, and a greater understanding of their family dynamics.  2) Learn and implement skills and strategies to better manage depression.  3) Learn and implement skills  and strategies to better manage anxiety  Goal 4) Grow in self understanding and become more resilient/self possessed in the face of criticism and other negative interactions   Client Statement of Needs: she would like to focus on working through an upsetting family problem, would like a deeper understanding of her reactions to criticism and how she perceives things negatively, wants to feel less anxious and depressed   Treatment Level: Outpatient Individual Psychotherapy  Symptoms:   Feels nervous, worries about different things, becomes irritable, low mood states, feels hopeless, loss of interest, sleep disruption (continuous), poor concentration, feels bad about herself  Client Treatment Preferences: would prefer in person but is also open to virtual appointments   Healthcare consumer's goal for treatment:  Psychologist, Royetta Crochet, [name] will support the patient's ability to achieve the goals identified. Cognitive Behavioral Therapy, Dialectical Behavioral Therapy, Motivational Interviewing, Behavior Activation and other evidenced-based practices will be used to promote progress towards healthy functioning.   Healthcare consumer Tyleigh Cabo will: Actively participate in therapy, working towards healthy functioning.    *Justification for Continuation/Discontinuation of Goal: R=Revised, O=Ongoing, A=Achieved, D=Discontinued  Goal 1) Work on repairing relationship with daughter through the use of better communication and  conflict management skills, and a greater understanding of their family dynamics.  Likert rating baseline date: 03/17/2022 Target Date Goal Was reviewed Status Code Progress towards goal/Likert rating   03/18/2023  O               Goal 2)  Learn and implement skills and strategies to better manage depression.   Likert rating baseline date: 03/17/2022 Target Date Goal Was reviewed Status Code Progress towards goal/Likert rating   03/18/2023            O               Goal 3) Learn and implement skills and strategies to better manage anxiety    Likert rating baseline date: 03/17/2022 Target Date Goal Was reviewed Status Code Progress towards goal/Likert rating   03/18/2023            O              Goal 4) Grow in self understanding and ability to self regulate during negative interactions   Likert rating baseline date: 03/17/2022 Target Date Goal Was reviewed Status Code Progress towards goal/Likert rating   03/18/2023            O              This plan has been reviewed and created by the following participants:  This plan will be reviewed at least every 12 months. Date Behavioral Health Clinician Date Guardian/Patient   03/17/2022 Royetta Crochet, [name]  03/17/2022 [name]                  Diagnoses:  Adjustment Disorder with mixed anxiety and depressed mood    Royetta Crochet, PhD

## 2022-03-23 ENCOUNTER — Other Ambulatory Visit (HOSPITAL_COMMUNITY): Payer: Self-pay

## 2022-03-23 DIAGNOSIS — F320 Major depressive disorder, single episode, mild: Secondary | ICD-10-CM | POA: Diagnosis not present

## 2022-03-23 MED ORDER — ESCITALOPRAM OXALATE 5 MG PO TABS
ORAL_TABLET | ORAL | 3 refills | Status: AC
  Filled 2022-03-23: qty 30, 33d supply, fill #0
  Filled 2022-04-20: qty 30, 33d supply, fill #1
  Filled 2022-05-12 (×2): qty 30, 33d supply, fill #2
  Filled 2022-09-29 – 2022-11-17 (×2): qty 30, 33d supply, fill #3

## 2022-03-26 ENCOUNTER — Ambulatory Visit (INDEPENDENT_AMBULATORY_CARE_PROVIDER_SITE_OTHER): Payer: PPO | Admitting: Psychology

## 2022-03-26 DIAGNOSIS — F4323 Adjustment disorder with mixed anxiety and depressed mood: Secondary | ICD-10-CM

## 2022-03-26 NOTE — Progress Notes (Signed)
PROGRESS NOTE:  Name: Melissa [name] Date: 03/26/2022 MRN: [id number] DOB: Nov 09, 1955 PCP: Carol Ada, MD  Time spent: 8:00 - 8:59 AM   Today I met with  Melissa [name] in remote video (WebEx) face-to-face individual psychotherapy.  Distance Site: Client's Home Orginating Site: Dr Jannifer Franklin Remote Office Consent: Obtained verbal consent to transmit  session remotely    Reason for Visit /Presenting Problem:  Melissa [name] is a 67 year old MWF who was referred by her PCP.  At Christmas there was an event involving her daughter that precipitated a family "blow up."  They've only seen one another once since then.  Melissa [name] has been finding herself getting irritable and "short" with her husband and she thinks it's because she is so upset about the situation with her daughter.  Melissa [name] is struggling with what to do and is fearful that she will never be able to have a relationship with her daughter.     Mental Status Exam: Appearance:   Casual and Fairly Groomed     Behavior:  Appropriate  Motor:  Normal  Speech/Language:   NA  Affect:  Appropriate  Mood:  anxious, depressed, and irritable  Thought process:  normal  Thought content:    WNL  Sensory/Perceptual disturbances:    WNL  Orientation:  oriented to person, place, time/date, and situation  Attention:  Good  Concentration:  Good  Memory:  Melissa [name] of knowledge:   Good  Insight:    Good  Judgment:   Good  Impulse Control:  Good     Individualized Treatment Plan       Strengths: kind, helpful, cheerful, resourceful  Supports: Husband, son and friends   Goal/Needs for Treatment:  In order of importance to patient 1) Work on repairing relationship with daughter through the use of better communication and conflict management  skills, and a greater understanding of their family dynamics.  2) Learn and implement skills and strategies to better manage depression.  3) Learn and implement skills and strategies  to better manage anxiety  Goal 4) Grow in self understanding and become more resilient/self possessed in the face of criticism and other negative interactions   Client Statement of Needs: she would like to focus on working through an upsetting family problem, would like a deeper understanding of her reactions to criticism and how she perceives things negatively, wants to feel less anxious and depressed   Treatment Level: Outpatient Individual Psychotherapy  Symptoms:   Feels nervous, worries about different things, becomes irritable, low mood states, feels hopeless, loss of interest, sleep disruption (continuous), poor concentration, feels bad about herself  Client Treatment Preferences: would prefer in person but is also open to virtual appointments   Healthcare consumer's goal for treatment:  Psychologist, Royetta Crochet, [name] will support the patient's ability to achieve the goals identified. Cognitive Behavioral Therapy, Dialectical Behavioral Therapy, Motivational Interviewing, Behavior Activation and other evidenced-based practices will be used to promote progress towards healthy functioning.   Healthcare consumer Melissa [name] will: Actively participate in therapy, working towards healthy functioning.    *Justification for Continuation/Discontinuation of Goal: R=Revised, O=Ongoing, A=Achieved, D=Discontinued  Goal 1) Work on repairing relationship with daughter through the use of better communication and  conflict management skills, and a greater understanding of their family dynamics.  Likert rating baseline date: 03/17/2022 Target Date Goal Was reviewed Status Code Progress towards goal/Likert rating   03/18/2023            O  Goal 2)  Learn and implement skills and strategies to better manage depression.   Likert rating baseline date: 03/17/2022 Target Date Goal Was reviewed Status Code Progress towards goal/Likert rating   03/18/2023           O               Goal 3) Learn and implement skills and strategies to better manage anxiety    Likert rating baseline date: 03/17/2022 Target Date Goal Was reviewed Status Code Progress towards goal/Likert rating   03/18/2023           O              Goal 4) Grow in self understanding and ability to self regulate during negative interactions   Likert rating baseline date: 03/17/2022 Target Date Goal Was reviewed Status Code Progress towards goal/Likert rating   03/18/2023           O              This plan has been reviewed and created by the following participants:  This plan will be reviewed at least every 12 months. Date Behavioral Health Clinician Date Guardian/Patient   03/17/2022 Royetta Crochet, [name]  03/17/2022 Melissa [name]                  Diagnoses:  Adjustment Disorder with mixed anxiety and depressed mood  Melissa [name] reports that while her daughter reached out briefly on her birthday but it is clear that they aren't talking like they used to.    Melissa [name] states that she met with Dr. Tamala Julian.  They d/ what has been going on with her daughter and our d/ about medication.  She was prescribed Lexapro 10 mg (half to start with).  I responded and asnwered all her questions about SSRI's.   Royetta Crochet, PhD

## 2022-04-16 ENCOUNTER — Ambulatory Visit (INDEPENDENT_AMBULATORY_CARE_PROVIDER_SITE_OTHER): Payer: PPO | Admitting: Psychology

## 2022-04-16 DIAGNOSIS — F4323 Adjustment disorder with mixed anxiety and depressed mood: Secondary | ICD-10-CM | POA: Diagnosis not present

## 2022-04-16 NOTE — Progress Notes (Signed)
PROGRESS NOTE:  Name: Melissa [name] Date: 04/16/2022 MRN: [id number] DOB: 05-Feb-1955 PCP: Carol Ada, MD  Time spent: 12:00 - 12:59 PM   Today I met with  Melissa [name] in remote video (WebEx) face-to-face individual psychotherapy.  Distance Site: Client's Home Orginating Site: Dr Jannifer Franklin Remote Office Consent: Obtained verbal consent to transmit  session remotely    Reason for Visit /Presenting Problem:  Melissa [name] is a 67 year old MWF who was referred by her PCP.  At Christmas there was an event involving her daughter that precipitated a family "blow up."  They've only seen one another once since then.  Melissa [name] has been finding herself getting irritable and "short" with her husband and she thinks it's because she is so upset about the situation with her daughter.  Melissa [name] is struggling with what to do and is fearful that she will never be able to have a relationship with her daughter.     Mental Status Exam: Appearance:   Casual and Fairly Groomed     Behavior:  Appropriate  Motor:  Normal  Speech/Language:   NA  Affect:  Appropriate  Mood:  anxious, depressed, and irritable  Thought process:  normal  Thought content:    WNL  Sensory/Perceptual disturbances:    WNL  Orientation:  oriented to person, place, time/date, and situation  Attention:  Good  Concentration:  Good  Memory:  [place] of knowledge:   Good  Insight:    Good  Judgment:   Good  Impulse Control:  Good     Individualized Treatment Plan       Strengths: kind, helpful, cheerful, resourceful  Supports: Husband, son and friends   Goal/Needs for Treatment:  In order of importance to patient 1) Work on repairing relationship with daughter through the use of better communication and conflict management  skills, and a greater understanding of their family dynamics.  2) Learn and implement skills and strategies to better manage depression.  3) Learn and implement skills and  strategies to better manage anxiety  Goal 4) Grow in self understanding and become more resilient/self possessed in the face of criticism and other negative interactions   Client Statement of Needs: she would like to focus on working through an upsetting family problem, would like a deeper understanding of her reactions to criticism and how she perceives things negatively, wants to feel less anxious and depressed   Treatment Level: Outpatient Individual Psychotherapy  Symptoms:   Feels nervous, worries about different things, becomes irritable, low mood states, feels hopeless, loss of interest, sleep disruption (continuous), poor concentration, feels bad about herself  Client Treatment Preferences: would prefer in person but is also open to virtual appointments   Healthcare consumer's goal for treatment:  Psychologist, Royetta Crochet, [name] will support the patient's ability to achieve the goals identified. Cognitive Behavioral Therapy, Dialectical Behavioral Therapy, Motivational Interviewing, Behavior Activation and other evidenced-based practices will be used to promote progress towards healthy functioning.   Healthcare consumer Melissa [name] will: Actively participate in therapy, working towards healthy functioning.    *Justification for Continuation/Discontinuation of Goal: R=Revised, O=Ongoing, A=Achieved, D=Discontinued  Goal 1) Work on repairing relationship with daughter through the use of better communication and  conflict management skills, and a greater understanding of their family dynamics.  Likert rating baseline date: 03/17/2022 Target Date Goal Was reviewed Status Code Progress towards goal/Likert rating   03/18/2023            O  Goal 2)  Learn and implement skills and strategies to better manage depression.   Likert rating baseline date: 03/17/2022 Target Date Goal Was reviewed Status Code Progress towards goal/Likert rating   03/18/2023           O               Goal 3) Learn and implement skills and strategies to better manage anxiety    Likert rating baseline date: 03/17/2022 Target Date Goal Was reviewed Status Code Progress towards goal/Likert rating   03/18/2023           O              Goal 4) Grow in self understanding and ability to self regulate during negative interactions   Likert rating baseline date: 03/17/2022 Target Date Goal Was reviewed Status Code Progress towards goal/Likert rating   03/18/2023           O              This plan has been reviewed and created by the following participants:  This plan will be reviewed at least every 12 months. Date Behavioral Health Clinician Date Guardian/Patient   03/17/2022 Royetta Crochet, [name]  03/17/2022 Melissa [name]                  Diagnoses:  Adjustment Disorder with mixed anxiety and depressed mood  Melissa [name] reports that she had a good break in [place] but got she admits that she got bored.  I made some inquires to assess if this was related to her depression.  Melissa [name] states that she feels like she is doing well on Lexapro.  As we continued to d/e it became clearer that it was more a matter of not planning well and being too accommodating of her husband's interests.  We d/ ways she might approach their annual trip differently next time.  Melissa [name] stated that she made a decision to cut back on how much wine she drinks at night.  We d/ her plan to reducing her intake.  I noted that while she had a good plan, that not directly telling her husband what she was intending to accomplish seemed counterproductive.  This opened up a d/ about passivity vs assertiveness and the cost of not being more direct.   Royetta Crochet, PhD

## 2022-04-21 ENCOUNTER — Other Ambulatory Visit (HOSPITAL_COMMUNITY): Payer: Self-pay

## 2022-04-23 ENCOUNTER — Ambulatory Visit (INDEPENDENT_AMBULATORY_CARE_PROVIDER_SITE_OTHER): Payer: PPO | Admitting: Psychology

## 2022-04-23 DIAGNOSIS — F4323 Adjustment disorder with mixed anxiety and depressed mood: Secondary | ICD-10-CM

## 2022-04-23 NOTE — Progress Notes (Signed)
PROGRESS NOTE:  Name: George Sweney Date: 04/23/2022 MRN: [id number] DOB: [birth year] PCP: Carol Ada, MD  Time spent: 10:00 - 10:59 AM   Today I met with Melissa [name] for in office face-to-face individual psychotherapy.   Reason for Visit /Presenting Problem:  Melissa [name] is a 67 year old MWF who was referred by her PCP.  At Christmas there was an event involving her daughter that precipitated a family "blow up."  They've only seen one another once since then.  Melissa [name] has been finding herself getting irritable and "short" with her husband and she thinks it's because she is so upset about the situation with her daughter.  Melissa [name] is struggling with what to do and is fearful that she will never be able to have a relationship with her daughter.     Mental Status Exam: Appearance:   Casual and Fairly Groomed     Behavior:  Appropriate  Motor:  Normal  Speech/Language:   NA  Affect:  Appropriate  Mood:  anxious, depressed, and irritable  Thought process:  normal  Thought content:    WNL  Sensory/Perceptual disturbances:    WNL  Orientation:  oriented to person, place, time/date, and situation  Attention:  Good  Concentration:  Good  Memory:  [place] of knowledge:   Good  Insight:    Good  Judgment:   Good  Impulse Control:  Good     Individualized Treatment Plan       Strengths: kind, helpful, cheerful, resourceful  Supports: Husband, son and friends   Goal/Needs for Treatment:  In order of importance to patient 1) Work on repairing relationship with daughter through the use of better communication and conflict management  skills, and a greater understanding of their family dynamics.  2) Learn and implement skills and strategies to better manage depression.  3) Learn and implement skills and strategies to better manage anxiety  Goal 4) Grow in self understanding and become more resilient/self possessed in the face of criticism and other negative  interactions   Client Statement of Needs: she would like to focus on working through an upsetting family problem, would like a deeper understanding of her reactions to criticism and how she perceives things negatively, wants to feel less anxious and depressed   Treatment Level: Outpatient Individual Psychotherapy  Symptoms:   Feels nervous, worries about different things, becomes irritable, low mood states, feels hopeless, loss of interest, sleep disruption (continuous), poor concentration, feels bad about herself  Client Treatment Preferences: would prefer in person but is also open to virtual appointments   Healthcare consumer's goal for treatment:  Psychologist, Melissa [name], [name] will support the patient's ability to achieve the goals identified. Cognitive Behavioral Therapy, Dialectical Behavioral Therapy, Motivational Interviewing, Behavior Activation and other evidenced-based practices will be used to promote progress towards healthy functioning.   Healthcare consumer Melissa [name] will: Actively participate in therapy, working towards healthy functioning.    *Justification for Continuation/Discontinuation of Goal: R=Revised, O=Ongoing, A=Achieved, D=Discontinued  Goal 1) Work on repairing relationship with daughter through the use of better communication and  conflict management skills, and a greater understanding of their family dynamics.  Likert rating baseline date: 03/17/2022 Target Date Goal Was reviewed Status Code Progress towards goal/Likert rating   03/18/2023            O               Goal 2)  Learn and implement skills and strategies  to better manage depression.   Likert rating baseline date: 03/17/2022 Target Date Goal Was reviewed Status Code Progress towards goal/Likert rating   03/18/2023           O              Goal 3) Learn and implement skills and strategies to better manage anxiety    Likert rating baseline date: 03/17/2022 Target Date Goal Was  reviewed Status Code Progress towards goal/Likert rating   03/18/2023           O              Goal 4) Grow in self understanding and ability to self regulate during negative interactions   Likert rating baseline date: 03/17/2022 Target Date Goal Was reviewed Status Code Progress towards goal/Likert rating   03/18/2023           O              This plan has been reviewed and created by the following participants:  This plan will be reviewed at least every 12 months. Date Behavioral Health Clinician Date Guardian/Patient   03/17/2022 Melissa [name], [name]  03/17/2022 [name]                  Diagnoses:  Adjustment Disorder with mixed anxiety and depressed mood    Melissa [name] reports that she had a good week.  She also stated that her medication seems to feel like it's working.  Chrisma states that she purchased the Anxiety and Phobia Workbook but was uncertain why it would be helpful.  Restated the rationale for introducing the [place] and she was able to see value in its inclusion in our work together.  Hena agreed to skim the first two introductory chapters.  F/u on our last session, Erline presented what she identified as her triggers: 1) hearing others talking about "what a great mother" they had, 2) seeing others who are close to their grandchildren and 3) thinking that her children would never say these things about her.  We spent the remainder of the session d/e/p these triggers and providing her with some needed alternative perspectives.   Melissa Crochet, PhD

## 2022-04-28 ENCOUNTER — Ambulatory Visit (INDEPENDENT_AMBULATORY_CARE_PROVIDER_SITE_OTHER): Payer: PPO | Admitting: Psychology

## 2022-04-28 DIAGNOSIS — F4323 Adjustment disorder with mixed anxiety and depressed mood: Secondary | ICD-10-CM | POA: Diagnosis not present

## 2022-04-28 NOTE — Progress Notes (Signed)
PROGRESS NOTE:  Name: Jenesa Schmeer Date: 04/28/2022 MRN: [id number] DOB: Sep 28, 1955 PCP: Carol Ada, MD  Time spent: 2:00 - 2:59 PM   Today I met with [name] for in office face-to-face individual psychotherapy.   Reason for Visit /Presenting Problem:  Ovella Nakada is a 67 year old MWF who was referred by her PCP.  At Christmas there was an event involving her daughter that precipitated a family "blow up."  They've only seen one another once since then.  [name] has been finding herself getting irritable and "short" with her husband and she thinks it's because she is so upset about the situation with her daughter.  [name] is struggling with what to do and is fearful that she will never be able to have a relationship with her daughter.     Mental Status Exam: Appearance:   Casual and Fairly Groomed     Behavior:  Appropriate  Motor:  Normal  Speech/Language:   NA  Affect:  Appropriate  Mood:  anxious, depressed, and irritable  Thought process:  normal  Thought content:    WNL  Sensory/Perceptual disturbances:    WNL  Orientation:  oriented to person, place, time/date, and situation  Attention:  Good  Concentration:  Good  Memory:  [place] of knowledge:   Good  Insight:    Good  Judgment:   Good  Impulse Control:  Good     Individualized Treatment Plan       Strengths: kind, helpful, cheerful, resourceful  Supports: Husband, son and friends   Goal/Needs for Treatment:  In order of importance to patient 1) Work on repairing relationship with daughter through the use of better communication and conflict management  skills, and a greater understanding of their family dynamics.  2) Learn and implement skills and strategies to better manage depression.  3) Learn and implement skills and strategies to better manage anxiety  Goal 4) Grow in self understanding and become more resilient/self possessed in the face of criticism and other negative  interactions   Client Statement of Needs: she would like to focus on working through an upsetting family problem, would like a deeper understanding of her reactions to criticism and how she perceives things negatively, wants to feel less anxious and depressed   Treatment Level: Outpatient Individual Psychotherapy  Symptoms:   Feels nervous, worries about different things, becomes irritable, low mood states, feels hopeless, loss of interest, sleep disruption (continuous), poor concentration, feels bad about herself  Client Treatment Preferences: would prefer in person but is also open to virtual appointments   Healthcare consumer's goal for treatment:  Psychologist, Royetta Crochet, [name] will support the patient's ability to achieve the goals identified. Cognitive Behavioral Therapy, Dialectical Behavioral Therapy, Motivational Interviewing, Behavior Activation and other evidenced-based practices will be used to promote progress towards healthy functioning.   Healthcare consumer Saraanne Conkling will: Actively participate in therapy, working towards healthy functioning.    *Justification for Continuation/Discontinuation of Goal: R=Revised, O=Ongoing, A=Achieved, D=Discontinued  Goal 1) Work on repairing relationship with daughter through the use of better communication and  conflict management skills, and a greater understanding of their family dynamics.  Likert rating baseline date: 03/17/2022 Target Date Goal Was reviewed Status Code Progress towards goal/Likert rating   03/18/2023            O               Goal 2)  Learn and implement skills and  strategies to better manage depression.   Likert rating baseline date: 03/17/2022 Target Date Goal Was reviewed Status Code Progress towards goal/Likert rating   03/18/2023           O              Goal 3) Learn and implement skills and strategies to better manage anxiety    Likert rating baseline date: 03/17/2022 Target Date Goal Was  reviewed Status Code Progress towards goal/Likert rating   03/18/2023           O              Goal 4) Grow in self understanding and ability to self regulate during negative interactions   Likert rating baseline date: 03/17/2022 Target Date Goal Was reviewed Status Code Progress towards goal/Likert rating   03/18/2023           O              This plan has been reviewed and created by the following participants:  This plan will be reviewed at least every 12 months. Date Behavioral Health Clinician Date Guardian/Patient   03/17/2022 Royetta Crochet, [name]  03/17/2022 [name]                  Diagnosis:  Adjustment Disorder with mixed anxiety and depressed mood Generalized Anxiety Disorder    [name] reports that she had a good week.  She states that she spent some time reading the first chapter in the [place].  She struggled to relate to some of the content which I clarified and we d/ further.  I answered her questions and provided additional psycho education about anxiety, its function and closing the stress cycle.   Royetta Crochet, PhD

## 2022-05-07 ENCOUNTER — Ambulatory Visit (INDEPENDENT_AMBULATORY_CARE_PROVIDER_SITE_OTHER): Payer: PPO | Admitting: Psychology

## 2022-05-07 DIAGNOSIS — F4323 Adjustment disorder with mixed anxiety and depressed mood: Secondary | ICD-10-CM

## 2022-05-07 NOTE — Progress Notes (Signed)
PROGRESS NOTE:  Name: Melissa [name] Date: 05/07/2022 MRN: [id number] DOB: April 19, 1955 PCP: Carol Ada, MD  Time spent: 2:00 - 2:59 PM   Today I met with [name] for in office face-to-face individual psychotherapy.   Reason for Visit /Presenting Problem:  Melissa [name] is a 67 year old MWF who was referred by her PCP.  At Christmas there was an event involving her daughter that precipitated a family "blow up."  They've only seen one another once since then.  Melissa [name] has been finding herself getting irritable and "short" with her husband and she thinks it's because she is so upset about the situation with her daughter.  Melissa [name] is struggling with what to do and is fearful that she will never be able to have a relationship with her daughter.     Mental Status Exam: Appearance:   Casual and Fairly Groomed     Behavior:  Appropriate  Motor:  Normal  Speech/Language:   NA  Affect:  Appropriate  Mood:  anxious, depressed, and irritable  Thought process:  normal  Thought content:    WNL  Sensory/Perceptual disturbances:    WNL  Orientation:  oriented to person, place, time/date, and situation  Attention:  Good  Concentration:  Good  Memory:  [place] of knowledge:   Good  Insight:    Good  Judgment:   Good  Impulse Control:  Good     Individualized Treatment Plan       Strengths: kind, helpful, cheerful, resourceful  Supports: Husband, son and friends   Goal/Needs for Treatment:  In order of importance to patient 1) Work on repairing relationship with daughter through the use of better communication and conflict management  skills, and a greater understanding of their family dynamics.  2) Learn and implement skills and strategies to better manage depression.  3) Learn and implement skills and strategies to better manage anxiety  Goal 4) Grow in self understanding and become more resilient/self possessed in the face of criticism and other negative  interactions   Client Statement of Needs: she would like to focus on working through an upsetting family problem, would like a deeper understanding of her reactions to criticism and how she perceives things negatively, wants to feel less anxious and depressed   Treatment Level: Outpatient Individual Psychotherapy  Symptoms:   Feels nervous, worries about different things, becomes irritable, low mood states, feels hopeless, loss of interest, sleep disruption (continuous), poor concentration, feels bad about herself  Client Treatment Preferences: would prefer in person but is also open to virtual appointments   Healthcare consumer's goal for treatment:  Psychologist, Royetta Crochet, [name] will support the patient's ability to achieve the goals identified. Cognitive Behavioral Therapy, Dialectical Behavioral Therapy, Motivational Interviewing, Behavior Activation and other evidenced-based practices will be used to promote progress towards healthy functioning.   Healthcare consumer Jacquelene Wuethrich will: Actively participate in therapy, working towards healthy functioning.    *Justification for Continuation/Discontinuation of Goal: R=Revised, O=Ongoing, A=Achieved, D=Discontinued  Goal 1) Work on repairing relationship with daughter through the use of better communication and  conflict management skills, and a greater understanding of their family dynamics.  Likert rating baseline date: 03/17/2022 Target Date Goal Was reviewed Status Code Progress towards goal/Likert rating   03/18/2023            O               Goal 2)  Learn and implement skills and strategies to  better manage depression.   Likert rating baseline date: 03/17/2022 Target Date Goal Was reviewed Status Code Progress towards goal/Likert rating   03/18/2023           O              Goal 3) Learn and implement skills and strategies to better manage anxiety    Likert rating baseline date: 03/17/2022 Target Date Goal Was  reviewed Status Code Progress towards goal/Likert rating   03/18/2023           O              Goal 4) Grow in self understanding and ability to self regulate during negative interactions   Likert rating baseline date: 03/17/2022 Target Date Goal Was reviewed Status Code Progress towards goal/Likert rating   03/18/2023           O              This plan has been reviewed and created by the following participants:  This plan will be reviewed at least every 12 months. Date Behavioral Health Clinician Date Guardian/Patient   03/17/2022 Royetta Crochet, [name]  03/17/2022 [name]                  Diagnosis:  Adjustment Disorder with mixed anxiety and depressed mood Generalized Anxiety Disorder    Melissa [name] reports that her adult children dropped by separately for the holiday weekend.  We d/e/p the differences in the ease in which she can interact with her son and his fiance versus her daughter.  I made some inquires about Carolyn's early life experiences, traditions they had and which traditions she wishes to continue with her grandchildren.  Lastly, pt was informed of upcoming changes in virtual technology, of my vacation and confirmed his upcoming appointments.   Royetta Crochet, PhD

## 2022-05-12 ENCOUNTER — Other Ambulatory Visit (HOSPITAL_COMMUNITY): Payer: Self-pay

## 2022-05-12 ENCOUNTER — Other Ambulatory Visit: Payer: Self-pay

## 2022-05-13 ENCOUNTER — Other Ambulatory Visit (HOSPITAL_COMMUNITY): Payer: Self-pay

## 2022-05-20 IMAGING — MG MM DIGITAL SCREENING BILAT W/ TOMO AND CAD
8 series · 9 of 24 positions shown · non-contrast
Comparison: Previous exam(s).

CLINICAL DATA: Screening.

EXAM:
DIGITAL SCREENING BILATERAL MAMMOGRAM WITH TOMOSYNTHESIS AND CAD
TECHNIQUE: Bilateral screening digital craniocaudal and mediolateral oblique
mammograms were obtained. Bilateral screening digital breast
tomosynthesis was performed. The images were evaluated with
computer-aided detection.

[L MLO synth-2D]
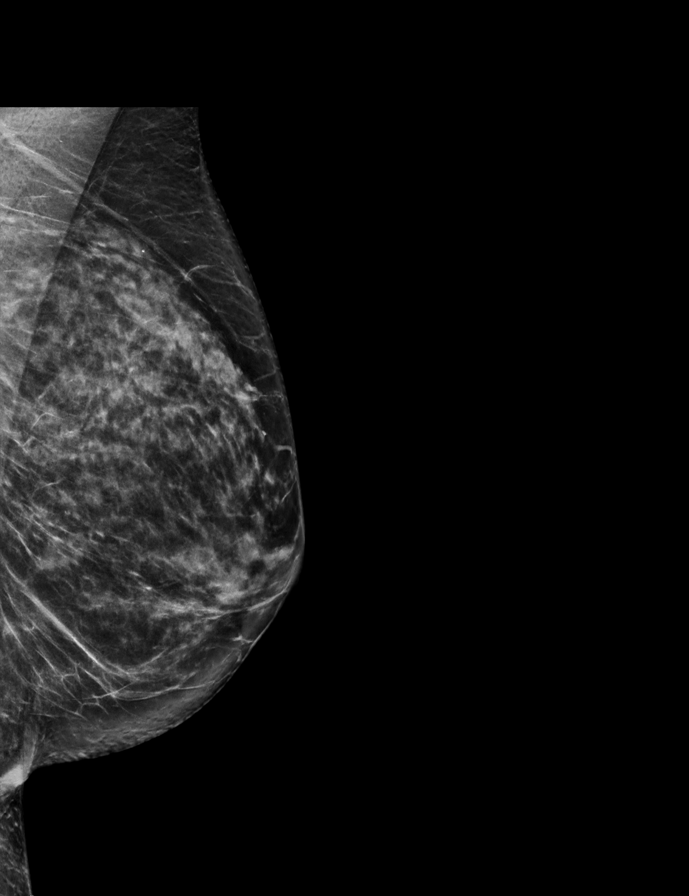

[R CC synth-2D]
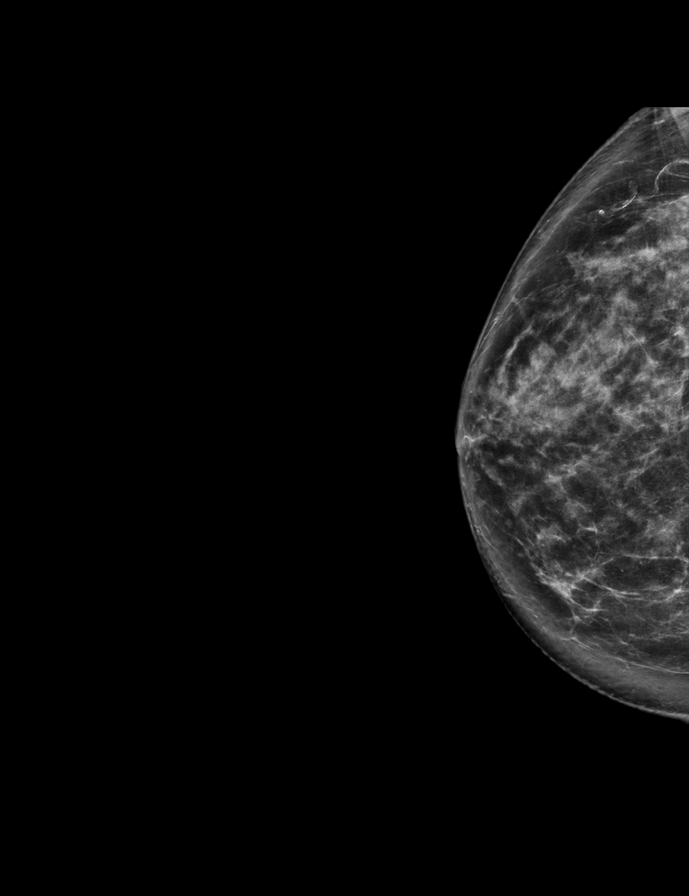

[L CC synth-2D]
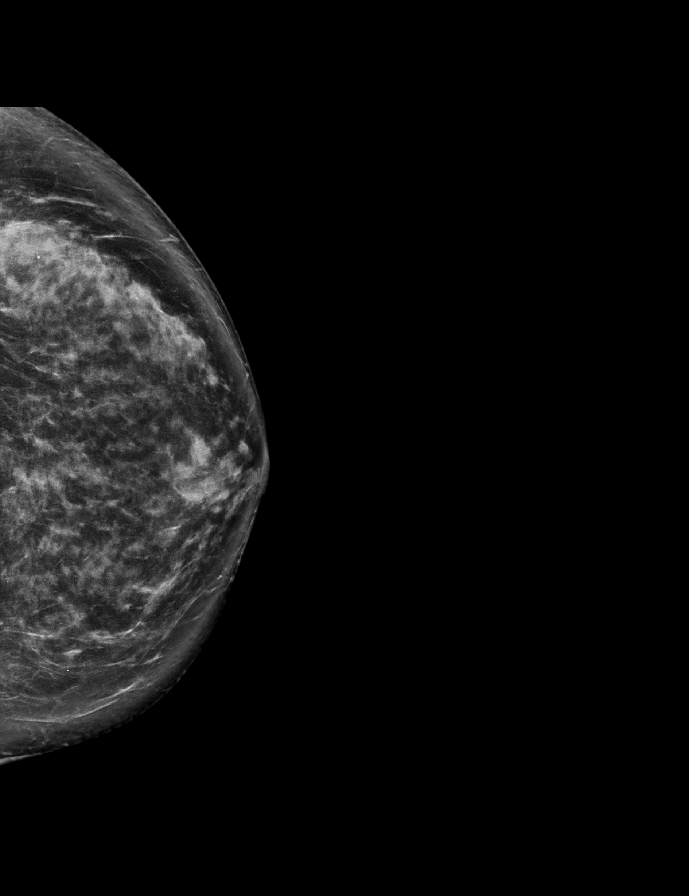

[R MLO synth-2D]
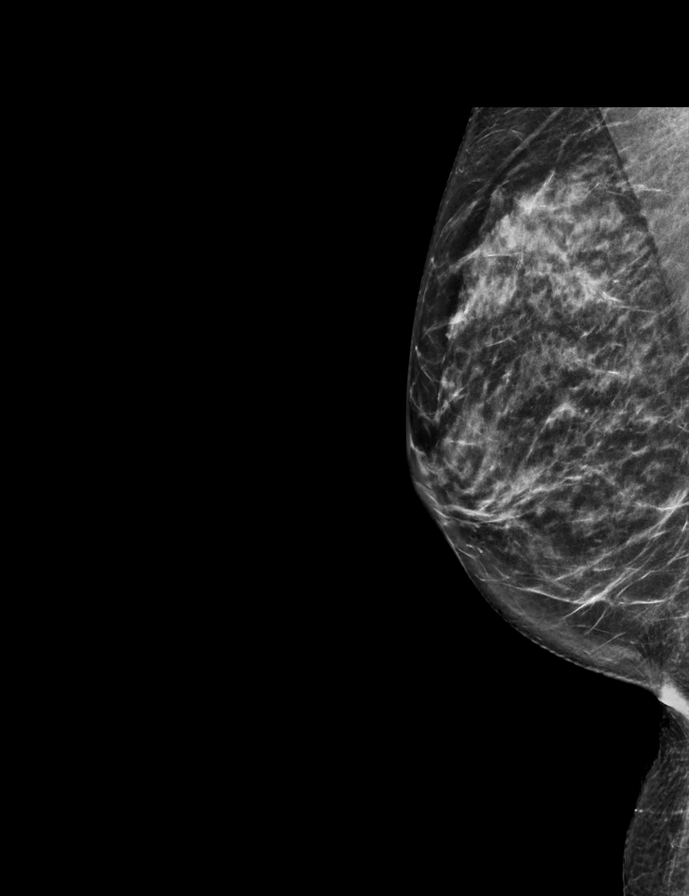

[L MLO tomo · 2 of 69 frames shown]
[frame 23/69]
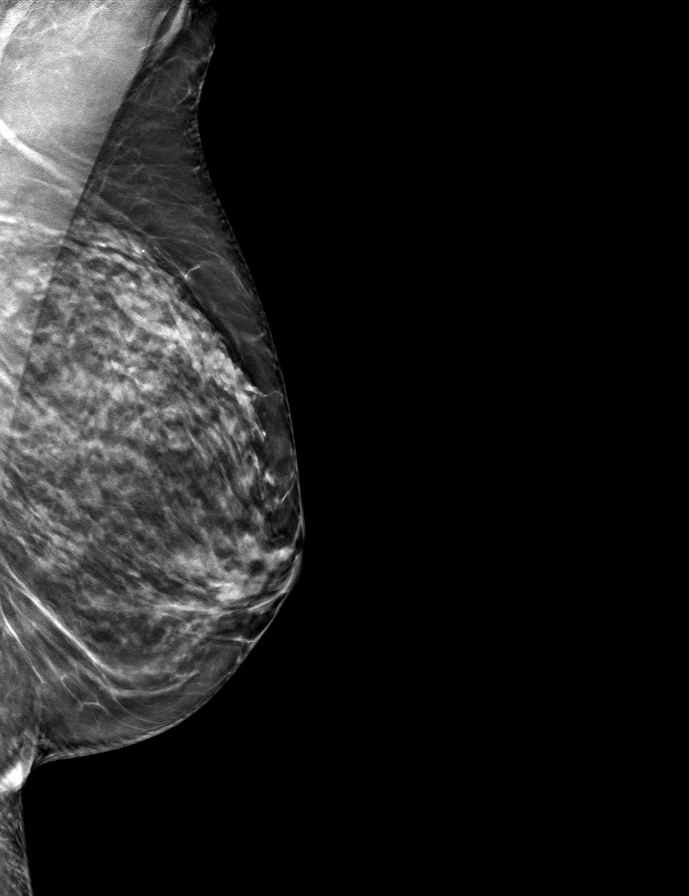
[frame 35/69]
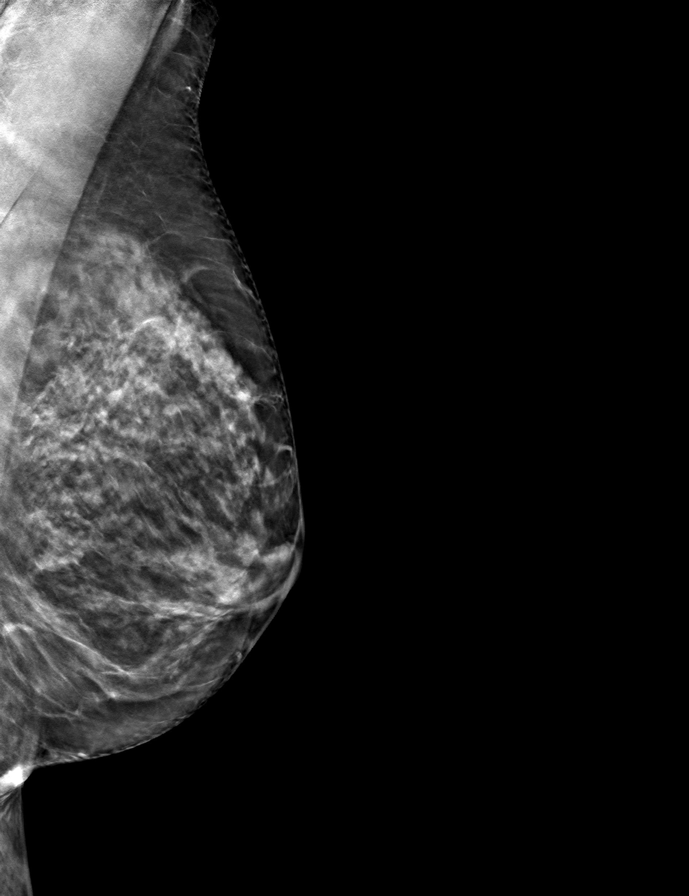

[L CC tomo · tomo slice 39/77.0]
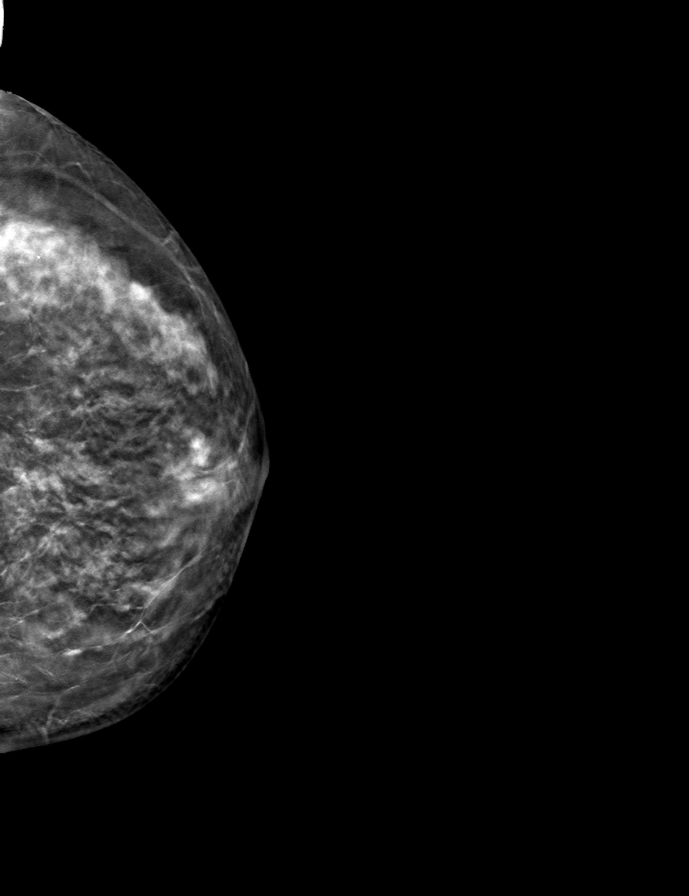

[R MLO tomo · tomo slice 32/63.0]
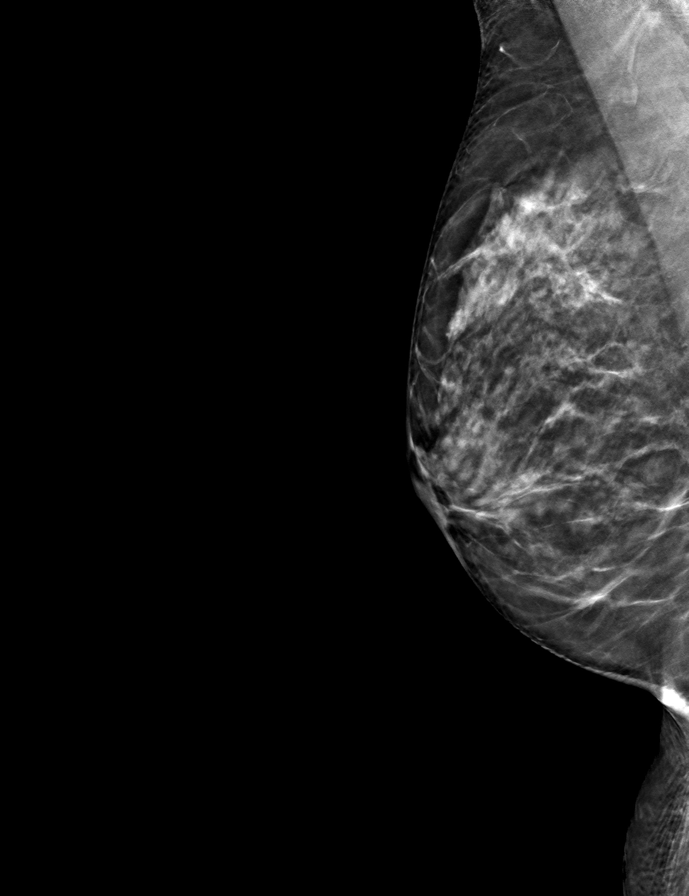

[R CC tomo · tomo slice 39/77.0]
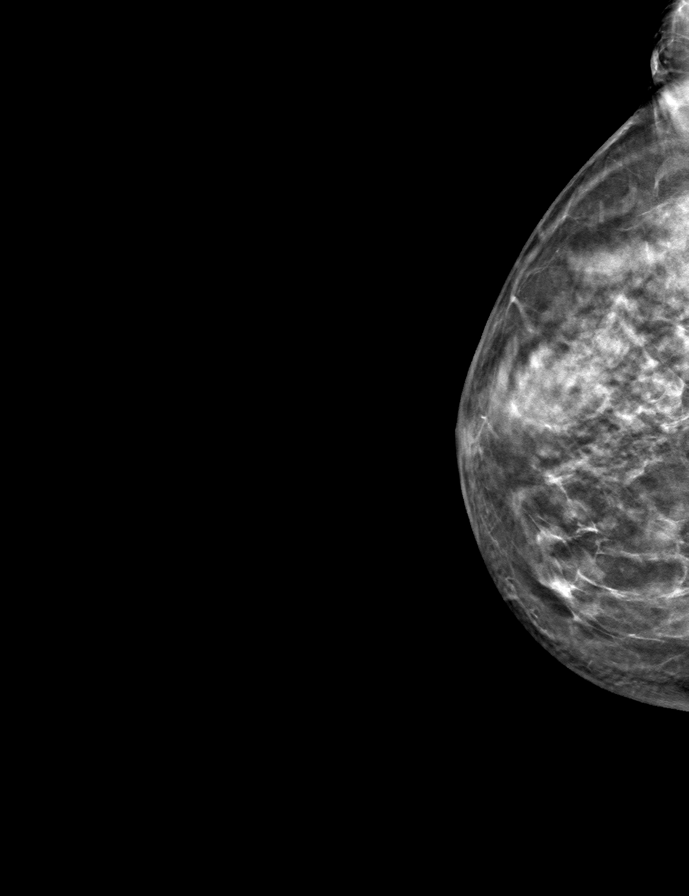

[9 of 24 positions shown; findings below may reference images not displayed]

ACR Breast Density Category c: The breast tissue is heterogeneously
dense, which may obscure small masses.
FINDINGS: There are no findings suspicious for malignancy.
IMPRESSION: No mammographic evidence of malignancy. A result letter of this
screening mammogram will be mailed directly to the patient.

RECOMMENDATION:
Screening mammogram in one year. (Code:[id number])

BI-RADS CATEGORY  1: Negative.

## 2022-06-04 ENCOUNTER — Other Ambulatory Visit (HOSPITAL_COMMUNITY): Payer: Self-pay

## 2022-06-04 DIAGNOSIS — L821 Other seborrheic keratosis: Secondary | ICD-10-CM | POA: Diagnosis not present

## 2022-06-04 DIAGNOSIS — E7800 Pure hypercholesterolemia, unspecified: Secondary | ICD-10-CM | POA: Diagnosis not present

## 2022-06-04 DIAGNOSIS — L570 Actinic keratosis: Secondary | ICD-10-CM | POA: Diagnosis not present

## 2022-06-04 DIAGNOSIS — G4700 Insomnia, unspecified: Secondary | ICD-10-CM | POA: Diagnosis not present

## 2022-06-04 DIAGNOSIS — D045 Carcinoma in situ of skin of trunk: Secondary | ICD-10-CM | POA: Diagnosis not present

## 2022-06-04 DIAGNOSIS — D485 Neoplasm of uncertain behavior of skin: Secondary | ICD-10-CM | POA: Diagnosis not present

## 2022-06-04 DIAGNOSIS — Z0000 Encounter for general adult medical examination without abnormal findings: Secondary | ICD-10-CM | POA: Diagnosis not present

## 2022-06-04 DIAGNOSIS — F419 Anxiety disorder, unspecified: Secondary | ICD-10-CM | POA: Diagnosis not present

## 2022-06-04 DIAGNOSIS — Z85828 Personal history of other malignant neoplasm of skin: Secondary | ICD-10-CM | POA: Diagnosis not present

## 2022-06-04 DIAGNOSIS — L309 Dermatitis, unspecified: Secondary | ICD-10-CM | POA: Diagnosis not present

## 2022-06-04 DIAGNOSIS — Z1331 Encounter for screening for depression: Secondary | ICD-10-CM | POA: Diagnosis not present

## 2022-06-04 DIAGNOSIS — C44319 Basal cell carcinoma of skin of other parts of face: Secondary | ICD-10-CM | POA: Diagnosis not present

## 2022-06-04 MED ORDER — ESCITALOPRAM OXALATE 5 MG PO TABS
5.0000 mg | ORAL_TABLET | Freq: Every day | ORAL | 1 refills | Status: DC
  Filled 2022-06-04: qty 90, 90d supply, fill #0
  Filled 2022-09-29: qty 90, 90d supply, fill #1

## 2022-06-18 ENCOUNTER — Ambulatory Visit (INDEPENDENT_AMBULATORY_CARE_PROVIDER_SITE_OTHER): Payer: PPO | Admitting: Psychology

## 2022-06-18 DIAGNOSIS — F4323 Adjustment disorder with mixed anxiety and depressed mood: Secondary | ICD-10-CM

## 2022-06-18 NOTE — Progress Notes (Signed)
PROGRESS NOTE:  Name: Melissa [name] Date: 06/18/2022 MRN: [id number] DOB: [birth year] PCP: Merri Brunette, MD  Time spent: 12:00 - 12:59 PM   Today I met with Melissa [name] for in office face-to-face individual psychotherapy.   Reason for Visit /Presenting Problem:  Melissa [name] is a 67 year old MWF who was referred by her PCP.  At Christmas there was an event involving her daughter that precipitated a family "blow up."  They've only seen one another once since then.  Melissa [name] has been finding herself getting irritable and "short" with her husband and she thinks it's because she is so upset about the situation with her daughter.  Melissa [name] is struggling with what to do and is fearful that she will never be able to have a relationship with her daughter.     Mental Status Exam: Appearance:   Casual and Fairly Groomed     Behavior:  Appropriate  Motor:  Normal  Speech/Language:   NA  Affect:  Appropriate  Mood:  anxious, depressed, and irritable  Thought process:  normal  Thought content:    WNL  Sensory/Perceptual disturbances:    WNL  Orientation:  oriented to person, place, time/date, and situation  Attention:  Good  Concentration:  Good  Memory:  [place]  [place] of knowledge:   Good  Insight:    Good  Judgment:   Good  Impulse Control:  Good     Individualized Treatment Plan       Strengths: kind, helpful, cheerful, resourceful  Supports: Husband, son and friends   Goal/Needs for Treatment:  In order of importance to patient 1) Work on repairing relationship with daughter through the use of better communication and conflict management  skills, and a greater understanding of their family dynamics.  2) Learn and implement skills and strategies to better manage depression.  3) Learn and implement skills and strategies to better manage anxiety  Goal 4) Grow in self understanding and become more resilient/self possessed in the face of criticism and other negative  interactions   Client Statement of Needs: she would like to focus on working through an upsetting family problem, would like a deeper understanding of her reactions to criticism and how she perceives things negatively, wants to feel less anxious and depressed   Treatment Level: Outpatient Individual Psychotherapy  Symptoms:   Feels nervous, worries about different things, becomes irritable, low mood states, feels hopeless, loss of interest, sleep disruption (continuous), poor concentration, feels bad about herself  Client Treatment Preferences: would prefer in person but is also open to virtual appointments   Healthcare consumer's goal for treatment:  Psychologist, Hilma Favors, [name] will support the patient's ability to achieve the goals identified. Cognitive Behavioral Therapy, Dialectical Behavioral Therapy, Motivational Interviewing, Behavior Activation and other evidenced-based practices will be used to promote progress towards healthy functioning.   Healthcare consumer Mersaydes Obst will: Actively participate in therapy, working towards healthy functioning.    *Justification for Continuation/Discontinuation of Goal: R=Revised, O=Ongoing, A=Achieved, D=Discontinued  Goal 1) Work on repairing relationship with daughter through the use of better communication and  conflict management skills, and a greater understanding of their family dynamics.  Likert rating baseline date: 03/17/2022 Target Date Goal Was reviewed Status Code Progress towards goal/Likert rating   03/18/2023            O               Goal 2)  Learn and implement skills and strategies  to better manage depression.   Likert rating baseline date: 03/17/2022 Target Date Goal Was reviewed Status Code Progress towards goal/Likert rating   03/18/2023           O              Goal 3) Learn and implement skills and strategies to better manage anxiety    Likert rating baseline date: 03/17/2022 Target Date Goal Was  reviewed Status Code Progress towards goal/Likert rating   03/18/2023           O              Goal 4) Grow in self understanding and ability to self regulate during negative interactions   Likert rating baseline date: 03/17/2022 Target Date Goal Was reviewed Status Code Progress towards goal/Likert rating   03/18/2023           O              This plan has been reviewed and created by the following participants:  This plan will be reviewed at least every 12 months. Date Behavioral Health Clinician Date Guardian/Patient   03/17/2022 Hilma Favors, [name]  03/17/2022 [place] Plan                  Diagnosis:  Adjustment Disorder with mixed anxiety and depressed mood Generalized Anxiety Disorder    Melissa [name] reports that they had a great cross country trip.  She shared that seeing her sister concerned her because of her declining health.  Melissa [name] has a different relationship with her brothers and his family.  We d/e/p family dynamics, aging and feeling sad to see some family members' declining health and fearing that this would be the last time she would see them.     Hilma Favors, PhD

## 2022-06-25 ENCOUNTER — Ambulatory Visit: Payer: PPO | Admitting: Psychology

## 2022-07-01 ENCOUNTER — Other Ambulatory Visit (HOSPITAL_COMMUNITY): Payer: Self-pay

## 2022-07-02 ENCOUNTER — Ambulatory Visit (INDEPENDENT_AMBULATORY_CARE_PROVIDER_SITE_OTHER): Payer: PPO | Admitting: Psychology

## 2022-07-02 ENCOUNTER — Other Ambulatory Visit (HOSPITAL_COMMUNITY): Payer: Self-pay

## 2022-07-02 ENCOUNTER — Other Ambulatory Visit: Payer: Self-pay

## 2022-07-02 DIAGNOSIS — F4323 Adjustment disorder with mixed anxiety and depressed mood: Secondary | ICD-10-CM

## 2022-07-02 MED ORDER — ESCITALOPRAM OXALATE 5 MG PO TABS
7.5000 mg | ORAL_TABLET | Freq: Every day | ORAL | 0 refills | Status: DC
  Filled 2022-07-02 – 2022-07-06 (×2): qty 135, 90d supply, fill #0

## 2022-07-02 NOTE — Progress Notes (Signed)
PROGRESS NOTE:  Name: Melissa [name] Date: 07/02/2022 MRN: [id number] DOB: 1955/09/27 PCP: Merri Brunette, MD  Time spent: 10:00 - 10:59 AM   Today I met with Melissa [name] for in office face-to-face individual psychotherapy.   Reason for Visit /Presenting Problem:  Melissa [name] is a 67 year old MWF who was referred by her PCP.  At Christmas there was an event involving her daughter that precipitated a family "blow up."  They've only seen one another once since then.  Melissa [name] has been finding herself getting irritable and "short" with her husband and she thinks it's because she is so upset about the situation with her daughter.  Melissa [name] is struggling with what to do and is fearful that she will never be able to have a relationship with her daughter.     Mental Status Exam: Appearance:   Casual and Fairly Groomed     Behavior:  Appropriate  Motor:  Normal  Speech/Language:   NA  Affect:  Appropriate  Mood:  anxious, depressed, and irritable  Thought process:  normal  Thought content:    WNL  Sensory/Perceptual disturbances:    WNL  Orientation:  oriented to person, place, time/date, and situation  Attention:  Good  Concentration:  Good  Memory:  Fair  Fund of knowledge:   Good  Insight:    Good  Judgment:   Good  Impulse Control:  Good     Individualized Treatment [name]       Strengths: kind, helpful, cheerful, resourceful  Supports: Husband, son and friends   Goal/Needs for Treatment:  In order of importance to patient 1) Work on repairing relationship with daughter through the use of better communication and conflict management  skills, and a greater understanding of their family dynamics.  2) Learn and implement skills and strategies to better manage depression.  3) Learn and implement skills and strategies to better manage anxiety  Goal 4) Grow in self understanding and become more resilient/self possessed in the face of criticism and other negative  interactions   Client Statement of Needs: she would like to focus on working through an upsetting family problem, would like a deeper understanding of her reactions to criticism and how she perceives things negatively, wants to feel less anxious and depressed   Treatment Level: Outpatient Individual Psychotherapy  Symptoms:   Feels nervous, worries about different things, becomes irritable, low mood states, feels hopeless, loss of interest, sleep disruption (continuous), poor concentration, feels bad about herself  Client Treatment Preferences: would prefer in person but is also open to virtual appointments   Healthcare consumer's goal for treatment:  Psychologist, Hilma Favors, [name] will support the patient's ability to achieve the goals identified. Cognitive Behavioral Therapy, Dialectical Behavioral Therapy, Motivational Interviewing, Behavior Activation and other evidenced-based practices will be used to promote progress towards healthy functioning.   Healthcare consumer Melissa [name] will: Actively participate in therapy, working towards healthy functioning.    *Justification for Continuation/Discontinuation of Goal: R=Revised, O=Ongoing, A=Achieved, D=Discontinued  Goal 1) Work on repairing relationship with daughter through the use of better communication and  conflict management skills, and a greater understanding of their family dynamics.  Likert rating baseline date: 03/17/2022 Target Date Goal Was reviewed Status Code Progress towards goal/Likert rating   03/18/2023            O               Goal 2)  Learn and implement skills and strategies to  better manage depression.   Likert rating baseline date: 03/17/2022 Target Date Goal Was reviewed Status Code Progress towards goal/Likert rating   03/18/2023           O              Goal 3) Learn and implement skills and strategies to better manage anxiety    Likert rating baseline date: 03/17/2022 Target Date Goal Was  reviewed Status Code Progress towards goal/Likert rating   03/18/2023           O              Goal 4) Grow in self understanding and ability to self regulate during negative interactions   Likert rating baseline date: 03/17/2022 Target Date Goal Was reviewed Status Code Progress towards goal/Likert rating   03/18/2023           O              This [name] has been reviewed and created by the following participants:  This [name] will be reviewed at least every 12 months. Date Behavioral Health Clinician Date Guardian/Patient   03/17/2022 Hilma Favors, [name]  03/17/2022 Melissa [name]                  Diagnosis:  Adjustment Disorder with mixed anxiety and depressed mood Generalized Anxiety Disorder    Melissa [name] reports that they increased her medication to 7.5 mg and states she is feeling more even keel.  She states that she took her older grandchildren out to lunch as I had suggested and had a nice visit.  Unfortunately, there was one situation in which she was left feeling upset as a result of her daughter's behavior and later her husband.  We d/e/p what occurred, how she responded and how things might have gone differently.  The situation with her daughter gave [place] a natural opportunity to integrate information related to being a highly sensitive person, "holding on to things," taking things personally and needing to better manage her negative self talk.  I sent her a link to a book on the highly sensitive person since she had a good response to reading Quiet.   Hilma Favors, PhD

## 2022-07-06 ENCOUNTER — Other Ambulatory Visit (HOSPITAL_COMMUNITY): Payer: Self-pay

## 2022-07-16 ENCOUNTER — Ambulatory Visit (INDEPENDENT_AMBULATORY_CARE_PROVIDER_SITE_OTHER): Payer: PPO | Admitting: Psychology

## 2022-07-16 DIAGNOSIS — F4323 Adjustment disorder with mixed anxiety and depressed mood: Secondary | ICD-10-CM | POA: Diagnosis not present

## 2022-07-16 NOTE — Progress Notes (Signed)
PROGRESS NOTE:  Name: Melissa [name] Date: 07/16/2022 MRN: [id number] DOB: [birth year] PCP: Merri Brunette, MD  Time spent: 10:02 - 10:59 AM   Today I met with Melissa [name] for in person, in office face-to-face individual psychotherapy.   Reason for Visit /Presenting Problem:  Melissa [name] is a 67 year old MWF who was referred by her PCP.  At Christmas there was an event involving her daughter that precipitated a family "blow up."  They've only seen one another once since then.  Melissa [name] has been finding herself getting irritable and "short" with her husband and she thinks it's because she is so upset about the situation with her daughter.  Melissa [name] is struggling with what to do and is fearful that she will never be able to have a relationship with her daughter.     Mental Status Exam: Appearance:   Casual and Fairly Groomed     Behavior:  Appropriate  Motor:  Normal  Speech/Language:   NA  Affect:  Appropriate  Mood:  anxious, depressed, and irritable  Thought process:  normal  Thought content:    WNL  Sensory/Perceptual disturbances:    WNL  Orientation:  oriented to person, place, time/date, and situation  Attention:  Good  Concentration:  Good  Memory:  Fair  Fund of knowledge:   Good  Insight:    Good  Judgment:   Good  Impulse Control:  Good     Individualized Treatment [name]       Strengths: kind, helpful, cheerful, resourceful  Supports: Husband, son and friends   Goal/Needs for Treatment:  In order of importance to patient 1) Work on repairing relationship with daughter through the use of better communication and conflict management  skills, and a greater understanding of their family dynamics.  2) Learn and implement skills and strategies to better manage depression.  3) Learn and implement skills and strategies to better manage anxiety  Goal 4) Grow in self understanding and become more resilient/self possessed in the face of criticism and other  negative interactions   Client Statement of Needs: she would like to focus on working through an upsetting family problem, would like a deeper understanding of her reactions to criticism and how she perceives things negatively, wants to feel less anxious and depressed   Treatment Level: Outpatient Individual Psychotherapy  Symptoms:   Feels nervous, worries about different things, becomes irritable, low mood states, feels hopeless, loss of interest, sleep disruption (continuous), poor concentration, feels bad about herself  Client Treatment Preferences: would prefer in person but is also open to virtual appointments   Healthcare consumer's goal for treatment:  Psychologist, Hilma Favors, [name] will support the patient's ability to achieve the goals identified. Cognitive Behavioral Therapy, Dialectical Behavioral Therapy, Motivational Interviewing, Behavior Activation and other evidenced-based practices will be used to promote progress towards healthy functioning.   Healthcare consumer Melissa [name] will: Actively participate in therapy, working towards healthy functioning.    *Justification for Continuation/Discontinuation of Goal: R=Revised, O=Ongoing, A=Achieved, D=Discontinued  Goal 1) Work on repairing relationship with daughter through the use of better communication and  conflict management skills, and a greater understanding of their family dynamics.  Likert rating baseline date: 03/17/2022 Target Date Goal Was reviewed Status Code Progress towards goal/Likert rating   03/18/2023            O               Goal 2)  Learn and implement skills and  strategies to better manage depression.   Likert rating baseline date: 03/17/2022 Target Date Goal Was reviewed Status Code Progress towards goal/Likert rating   03/18/2023           O              Goal 3) Learn and implement skills and strategies to better manage anxiety    Likert rating baseline date: 03/17/2022 Target Date Goal  Was reviewed Status Code Progress towards goal/Likert rating   03/18/2023           O              Goal 4) Grow in self understanding and ability to self regulate during negative interactions   Likert rating baseline date: 03/17/2022 Target Date Goal Was reviewed Status Code Progress towards goal/Likert rating   03/18/2023           O              This [name] has been reviewed and created by the following participants:  This [name] will be reviewed at least every 12 months. Date Behavioral Health Clinician Date Guardian/Patient   03/17/2022 Hilma Favors, [name]  03/17/2022 Melissa [name]                  Diagnosis:  Adjustment Disorder with mixed anxiety and depressed mood Generalized Anxiety Disorder    Melissa [name] reports that she went out with her husband and two other couples to an event in [place].  She shared that it was too much and she found herself shutting down and feeling flat.  We d/e/p that she needed to be more intentional about making plans that included some quiet restorative time.  We d/ that the changes she's made in communicating with her husband could expand to her son and other situations in order to find more enjoyment in her life.    Hilma Favors, PhD

## 2022-09-10 ENCOUNTER — Other Ambulatory Visit: Payer: Self-pay | Admitting: Family Medicine

## 2022-09-10 DIAGNOSIS — Z1231 Encounter for screening mammogram for malignant neoplasm of breast: Secondary | ICD-10-CM

## 2022-09-29 ENCOUNTER — Other Ambulatory Visit (HOSPITAL_COMMUNITY): Payer: Self-pay

## 2022-09-29 ENCOUNTER — Other Ambulatory Visit: Payer: Self-pay

## 2022-09-30 ENCOUNTER — Other Ambulatory Visit (HOSPITAL_COMMUNITY): Payer: Self-pay

## 2022-10-01 ENCOUNTER — Ambulatory Visit: Payer: PPO | Admitting: Psychology

## 2022-10-08 ENCOUNTER — Ambulatory Visit: Payer: PPO | Admitting: Psychology

## 2022-10-14 DIAGNOSIS — H9201 Otalgia, right ear: Secondary | ICD-10-CM | POA: Diagnosis not present

## 2022-10-14 DIAGNOSIS — H903 Sensorineural hearing loss, bilateral: Secondary | ICD-10-CM | POA: Diagnosis not present

## 2022-10-14 DIAGNOSIS — H6123 Impacted cerumen, bilateral: Secondary | ICD-10-CM | POA: Diagnosis not present

## 2022-10-15 ENCOUNTER — Ambulatory Visit (INDEPENDENT_AMBULATORY_CARE_PROVIDER_SITE_OTHER): Payer: PPO | Admitting: Psychology

## 2022-10-15 DIAGNOSIS — F4323 Adjustment disorder with mixed anxiety and depressed mood: Secondary | ICD-10-CM

## 2022-10-15 NOTE — Progress Notes (Signed)
PROGRESS NOTE:  Name: Melissa [name] Date: 10/15/2022 MRN: [id number] DOB: December 28, 1955 PCP: Merri Brunette, MD  Time spent: 10:02 - 10:59 AM   Today I met with Melissa [name] for in person, in office face-to-face individual psychotherapy.   Reason for Visit /Presenting Problem:  Melissa [name] is a 67 year old MWF who was referred by her PCP.  At Christmas there was an event involving her daughter that precipitated a family "blow up."  They've only seen one another once since then.  Melissa [name] has been finding herself getting irritable and "short" with her husband and she thinks it's because she is so upset about the situation with her daughter.  Melissa [name] is struggling with what to do and is fearful that she will never be able to have a relationship with her daughter.     Mental Status Exam: Appearance:   Casual and Fairly Groomed     Behavior:  Appropriate  Motor:  Normal  Speech/Language:   NA  Affect:  Appropriate  Mood:  anxious, depressed, and irritable  Thought process:  normal  Thought content:    WNL  Sensory/Perceptual disturbances:    WNL  Orientation:  oriented to person, place, time/date, and situation  Attention:  Good  Concentration:  Good  Memory:  Fair  Fund of knowledge:   Good  Insight:    Good  Judgment:   Good  Impulse Control:  Good     Individualized Treatment Plan       Strengths: kind, helpful, cheerful, resourceful  Supports: Husband, son and friends   Goal/Needs for Treatment:  In order of importance to patient 1) Work on repairing relationship with daughter through the use of better communication and conflict management  skills, and a greater understanding of their family dynamics.  2) Learn and implement skills and strategies to better manage depression.  3) Learn and implement skills and strategies to better manage anxiety  Goal 4) Grow in self understanding and become more resilient/self possessed in the face of criticism and  other negative interactions   Client Statement of Needs: she would like to focus on working through an upsetting family problem, would like a deeper understanding of her reactions to criticism and how she perceives things negatively, wants to feel less anxious and depressed   Treatment Level: Outpatient Individual Psychotherapy  Symptoms:   Feels nervous, worries about different things, becomes irritable, low mood states, feels hopeless, loss of interest, sleep disruption (continuous), poor concentration, feels bad about herself  Client Treatment Preferences: would prefer in person but is also open to virtual appointments   Healthcare consumer's goal for treatment:  Psychologist, Hilma Favors, [name] will support the patient's ability to achieve the goals identified. Cognitive Behavioral Therapy, Dialectical Behavioral Therapy, Motivational Interviewing, Behavior Activation and other evidenced-based practices will be used to promote progress towards healthy functioning.   Healthcare consumer Melissa [name] will: Actively participate in therapy, working towards healthy functioning.    *Justification for Continuation/Discontinuation of Goal: R=Revised, O=Ongoing, A=Achieved, D=Discontinued  Goal 1) Work on repairing relationship with daughter through the use of better communication and  conflict management skills, and a greater understanding of their family dynamics.  Likert rating baseline date: 03/17/2022 Target Date Goal Was reviewed Status Code Progress towards goal/Likert rating   03/18/2023            O               Goal 2)  Learn and implement skills  and strategies to better manage depression.   Likert rating baseline date: 03/17/2022 Target Date Goal Was reviewed Status Code Progress towards goal/Likert rating   03/18/2023           O              Goal 3) Learn and implement skills and strategies to better manage anxiety    Likert rating baseline date: 03/17/2022 Target Date  Goal Was reviewed Status Code Progress towards goal/Likert rating   03/18/2023           O              Goal 4) Grow in self understanding and ability to self regulate during negative interactions   Likert rating baseline date: 03/17/2022 Target Date Goal Was reviewed Status Code Progress towards goal/Likert rating   03/18/2023           O              This plan has been reviewed and created by the following participants:  This plan will be reviewed at least every 12 months. Date Behavioral Health Clinician Date Guardian/Patient   03/17/2022 Hilma Favors, [name]  03/17/2022 [facility] Plan                  Diagnosis:  Adjustment Disorder with mixed anxiety and depressed mood Generalized Anxiety Disorder    It has been three month since we last met.  Melissa [name] reports that she had a nice trip with her husband [place] across the country.  She brought me up-to-date on the important things that occurred over the summer.  We touched base on how she was managing her depression, her relationship with her daughter and possible upcoming changes in her son's life.  Related to her depression, Melissa [name] states that despite increasing her antidepressant, she is still feeling pretty flat, and continues to have a hard to getting motivated to do what she needs to do outside of her normal daily routine.  We had a d/ about depression, ADD and dopamine.  I provided some psycho education about ways to encourage dopamine production to improve initiating,  motivation and focus.  Lastly, I mentioned that adding Wellbutrin might be a good option for her and I encouraged her to consult with her PCP.  Hilma Favors, PhD

## 2022-10-20 ENCOUNTER — Other Ambulatory Visit (HOSPITAL_BASED_OUTPATIENT_CLINIC_OR_DEPARTMENT_OTHER): Payer: Self-pay

## 2022-10-20 MED ORDER — FLUAD 0.5 ML IM SUSY
0.5000 mL | PREFILLED_SYRINGE | Freq: Once | INTRAMUSCULAR | 0 refills | Status: AC
  Filled 2022-10-20: qty 0.5, 1d supply, fill #0

## 2022-10-29 ENCOUNTER — Ambulatory Visit
Admission: RE | Admit: 2022-10-29 | Discharge: 2022-10-29 | Disposition: A | Discharge status: Home / Self Care | Payer: PPO | Source: Ambulatory Visit | Attending: Family Medicine | Admitting: Family Medicine

## 2022-10-29 ENCOUNTER — Ambulatory Visit: Payer: PPO

## 2022-10-29 DIAGNOSIS — Z1231 Encounter for screening mammogram for malignant neoplasm of breast: Secondary | ICD-10-CM | POA: Diagnosis not present

## 2022-11-04 ENCOUNTER — Ambulatory Visit: Payer: PPO | Admitting: Psychology

## 2022-11-04 DIAGNOSIS — F411 Generalized anxiety disorder: Secondary | ICD-10-CM

## 2022-11-04 DIAGNOSIS — F4323 Adjustment disorder with mixed anxiety and depressed mood: Secondary | ICD-10-CM | POA: Diagnosis not present

## 2022-11-04 DIAGNOSIS — F330 Major depressive disorder, recurrent, mild: Secondary | ICD-10-CM

## 2022-11-04 NOTE — Progress Notes (Signed)
PROGRESS NOTE:  Name: Melissa [name] Date: 11/04/2022 MRN: [id number] DOB: 05-Nov-1955 PCP: Merri Brunette, MD  Time spent: 3:02 - 3:59 PM   Today I met with Melissa [name] for in person, in office face-to-face individual psychotherapy.   Reason for Visit /Presenting Problem:  Melissa [name] is a 66 year old MWF who was referred by her PCP.  At Christmas there was an event involving her daughter that precipitated a family "blow up."  They've only seen one another once since then.  Melissa [name] has been finding herself getting irritable and "short" with her husband and she thinks it's because she is so upset about the situation with her daughter.  Melissa [name] is struggling with what to do and is fearful that she will never be able to have a relationship with her daughter.     Mental Status Exam: Appearance:   Casual and Fairly Groomed     Behavior:  Appropriate  Motor:  Normal  Speech/Language:   NA  Affect:  Appropriate  Mood:  anxious, depressed, and irritable  Thought process:  normal  Thought content:    WNL  Sensory/Perceptual disturbances:    WNL  Orientation:  oriented to person, place, time/date, and situation  Attention:  Good  Concentration:  Good  Memory:  Fair  Fund of knowledge:   Good  Insight:    Good  Judgment:   Good  Impulse Control:  Good     Individualized Treatment [name]       Strengths: kind, helpful, cheerful, resourceful  Supports: Husband, son and friends   Goal/Needs for Treatment:  In order of importance to patient 1) Work on repairing relationship with daughter through the use of better communication and conflict management  skills, and a greater understanding of their family dynamics.  2) Learn and implement skills and strategies to better manage depression.  3) Learn and implement skills and strategies to better manage anxiety  Goal 4) Grow in self understanding and become more resilient/self possessed in the face of criticism and other  negative interactions   Client Statement of Needs: she would like to focus on working through an upsetting family problem, would like a deeper understanding of her reactions to criticism and how she perceives things negatively, wants to feel less anxious and depressed   Treatment Level: Outpatient Individual Psychotherapy  Symptoms:   Feels nervous, worries about different things, becomes irritable, low mood states, feels hopeless, loss of interest, sleep disruption (continuous), poor concentration, feels bad about herself  Client Treatment Preferences: would prefer in person but is also open to virtual appointments   Healthcare consumer's goal for treatment:  Psychologist, Hilma Favors, [name] will support the patient's ability to achieve the goals identified. Cognitive Behavioral Therapy, Dialectical Behavioral Therapy, Motivational Interviewing, Behavior Activation and other evidenced-based practices will be used to promote progress towards healthy functioning.   Healthcare consumer Melissa [name] will: Actively participate in therapy, working towards healthy functioning.    *Justification for Continuation/Discontinuation of Goal: R=Revised, O=Ongoing, A=Achieved, D=Discontinued  Goal 1) Work on repairing relationship with daughter through the use of better communication and  conflict management skills, and a greater understanding of their family dynamics.  Likert rating baseline date: 03/17/2022 Target Date Goal Was reviewed Status Code Progress towards goal/Likert rating   03/18/2023            O               Goal 2)  Learn and implement skills  and strategies to better manage depression.   Likert rating baseline date: 03/17/2022 Target Date Goal Was reviewed Status Code Progress towards goal/Likert rating   03/18/2023           O              Goal 3) Learn and implement skills and strategies to better manage anxiety    Likert rating baseline date: 03/17/2022 Target Date Goal  Was reviewed Status Code Progress towards goal/Likert rating   03/18/2023           O              Goal 4) Grow in self understanding and ability to self regulate during negative interactions   Likert rating baseline date: 03/17/2022 Target Date Goal Was reviewed Status Code Progress towards goal/Likert rating   03/18/2023           O              This [name] has been reviewed and created by the following participants:  This [name] will be reviewed at least every 12 months. Date Behavioral Health Clinician Date Guardian/Patient   03/17/2022 Hilma Favors, [name]  03/17/2022 Melissa [name]                  Diagnosis:  Adjustment Disorder with mixed anxiety and depressed mood Generalized Anxiety Disorder  Melissa [name] reports that he has been feeling more depressed than she has in a while.  We d/e/p several possible contributing factors, her anxiety about feeling disconnected and we made connections between the past and present.  I recommended a book about Attachment Styles to help [place] understand some of her experiences more deeply.    Hilma Favors, PhD

## 2022-11-18 ENCOUNTER — Other Ambulatory Visit (HOSPITAL_COMMUNITY): Payer: Self-pay

## 2022-11-26 ENCOUNTER — Ambulatory Visit: Payer: PPO | Admitting: Psychology

## 2022-11-26 DIAGNOSIS — F4323 Adjustment disorder with mixed anxiety and depressed mood: Secondary | ICD-10-CM | POA: Diagnosis not present

## 2022-11-26 DIAGNOSIS — F411 Generalized anxiety disorder: Secondary | ICD-10-CM | POA: Diagnosis not present

## 2022-11-26 DIAGNOSIS — F330 Major depressive disorder, recurrent, mild: Secondary | ICD-10-CM

## 2022-11-26 NOTE — Progress Notes (Signed)
PROGRESS NOTE:  Name: Melissa [name] Date: 11/26/2022 MRN: [id number] DOB: [birth year] PCP: Merri Brunette, MD  Time spent: 8:02 AM- 8:59 AM   Today I met with Melissa [name] for in person, in office face-to-face individual psychotherapy.   Reason for Visit /Presenting Problem:  Melissa [name] is a 67 year old MWF who was referred by her PCP.  At Christmas there was an event involving her daughter that precipitated a family "blow up."  They've only seen one another once since then.  Melissa [name] has been finding herself getting irritable and "short" with her husband and she thinks it's because she is so upset about the situation with her daughter.  Melissa [name] is struggling with what to do and is fearful that she will never be able to have a relationship with her daughter.     Mental Status Exam: Appearance:   Casual and Fairly Groomed     Behavior:  Appropriate  Motor:  Normal  Speech/Language:   NA  Affect:  Appropriate  Mood:  anxious, depressed, and irritable  Thought process:  normal  Thought content:    WNL  Sensory/Perceptual disturbances:    WNL  Orientation:  oriented to person, place, time/date, and situation  Attention:  Good  Concentration:  Good  Memory:  Fair  Fund of knowledge:   Good  Insight:    Good  Judgment:   Good  Impulse Control:  Good     Individualized Treatment [name]       Strengths: kind, helpful, cheerful, resourceful  Supports: Husband, son and friends   Goal/Needs for Treatment:  In order of importance to patient 1) Work on repairing relationship with daughter through the use of better communication and conflict management  skills, and a greater understanding of their family dynamics.  2) Learn and implement skills and strategies to better manage depression.  3) Learn and implement skills and strategies to better manage anxiety  Goal 4) Grow in self understanding and become more resilient/self possessed in the face of criticism and  other negative interactions   Client Statement of Needs: she would like to focus on working through an upsetting family problem, would like a deeper understanding of her reactions to criticism and how she perceives things negatively, wants to feel less anxious and depressed   Treatment Level: Outpatient Individual Psychotherapy  Symptoms:   Feels nervous, worries about different things, becomes irritable, low mood states, feels hopeless, loss of interest, sleep disruption (continuous), poor concentration, feels bad about herself  Client Treatment Preferences: would prefer in person but is also open to virtual appointments   Healthcare consumer's goal for treatment:  Psychologist, Melissa [name], [name] will support the patient's ability to achieve the goals identified. Cognitive Behavioral Therapy, Dialectical Behavioral Therapy, Motivational Interviewing, Behavior Activation and other evidenced-based practices will be used to promote progress towards healthy functioning.   Healthcare consumer Melissa [name] will: Actively participate in therapy, working towards healthy functioning.    *Justification for Continuation/Discontinuation of Goal: R=Revised, O=Ongoing, A=Achieved, D=Discontinued  Goal 1) Work on repairing relationship with daughter through the use of better communication and  conflict management skills, and a greater understanding of their family dynamics.  Likert rating baseline date: 03/17/2022 Target Date Goal Was reviewed Status Code Progress towards goal/Likert rating   03/18/2023            O               Goal 2)  Learn and implement skills  and strategies to better manage depression.   Likert rating baseline date: 03/17/2022 Target Date Goal Was reviewed Status Code Progress towards goal/Likert rating   03/18/2023           O              Goal 3) Learn and implement skills and strategies to better manage anxiety    Likert rating baseline date: 03/17/2022 Target Date  Goal Was reviewed Status Code Progress towards goal/Likert rating   03/18/2023           O              Goal 4) Grow in self understanding and ability to self regulate during negative interactions   Likert rating baseline date: 03/17/2022 Target Date Goal Was reviewed Status Code Progress towards goal/Likert rating   03/18/2023           O              This [name] has been reviewed and created by the following participants:  This [name] will be reviewed at least every 12 months. Date Behavioral Health Clinician Date Guardian/Patient   03/17/2022 Melissa [name], [name]  03/17/2022 Melissa [name]                  Diagnosis:  Adjustment Disorder with mixed anxiety and depressed mood Generalized Anxiety Disorder  Melissa [name] reports that she has been feeling a bit more motivated but is still having trouble initiating.  Since June she continues to take Lexapro 10mg .  We d/ the possibility of increasing her Lexapro or of adding Wellbutrin to help with initiating/motivation and attention/focus.  Melissa [name] agreed to speak her PCP.    Melissa [name] shared a recent interaction with her daughter.  We d/e/p what occurred, how this fit in with previous patterns of interaction and how to keep things moving forward in a positive manner.  Melissa Favors, PhD

## 2022-11-28 ENCOUNTER — Other Ambulatory Visit (HOSPITAL_COMMUNITY): Payer: Self-pay

## 2022-11-30 ENCOUNTER — Other Ambulatory Visit (HOSPITAL_COMMUNITY): Payer: Self-pay

## 2022-11-30 MED ORDER — ESCITALOPRAM OXALATE 5 MG PO TABS
7.5000 mg | ORAL_TABLET | Freq: Every day | ORAL | 0 refills | Status: AC
  Filled 2022-11-30: qty 135, 90d supply, fill #0

## 2022-11-30 MED ORDER — ESCITALOPRAM OXALATE 5 MG PO TABS
7.5000 mg | ORAL_TABLET | Freq: Every day | ORAL | 0 refills | Status: AC
  Filled 2022-11-30: qty 135, 90d supply, fill #0
  Filled 2022-12-02: qty 12, 8d supply, fill #0

## 2022-12-02 ENCOUNTER — Other Ambulatory Visit (HOSPITAL_COMMUNITY): Payer: Self-pay

## 2022-12-07 ENCOUNTER — Other Ambulatory Visit (HOSPITAL_COMMUNITY): Payer: Self-pay

## 2022-12-07 DIAGNOSIS — F419 Anxiety disorder, unspecified: Secondary | ICD-10-CM | POA: Diagnosis not present

## 2022-12-07 DIAGNOSIS — E7800 Pure hypercholesterolemia, unspecified: Secondary | ICD-10-CM | POA: Diagnosis not present

## 2022-12-07 DIAGNOSIS — R5383 Other fatigue: Secondary | ICD-10-CM | POA: Diagnosis not present

## 2022-12-07 DIAGNOSIS — R030 Elevated blood-pressure reading, without diagnosis of hypertension: Secondary | ICD-10-CM | POA: Diagnosis not present

## 2022-12-07 DIAGNOSIS — G4700 Insomnia, unspecified: Secondary | ICD-10-CM | POA: Diagnosis not present

## 2022-12-07 MED ORDER — ESCITALOPRAM OXALATE 10 MG PO TABS
10.0000 mg | ORAL_TABLET | Freq: Every day | ORAL | 1 refills | Status: DC
  Filled 2022-12-07: qty 90, 90d supply, fill #0
  Filled 2023-02-08 – 2023-03-03 (×2): qty 90, 90d supply, fill #1

## 2022-12-08 ENCOUNTER — Ambulatory Visit (INDEPENDENT_AMBULATORY_CARE_PROVIDER_SITE_OTHER): Payer: PPO | Admitting: Psychology

## 2022-12-08 DIAGNOSIS — F330 Major depressive disorder, recurrent, mild: Secondary | ICD-10-CM

## 2022-12-08 DIAGNOSIS — F411 Generalized anxiety disorder: Secondary | ICD-10-CM | POA: Diagnosis not present

## 2022-12-08 DIAGNOSIS — F4323 Adjustment disorder with mixed anxiety and depressed mood: Secondary | ICD-10-CM | POA: Diagnosis not present

## 2022-12-08 NOTE — Progress Notes (Addendum)
PROGRESS NOTE:  Name: Melissa [name] Date: 12/08/2022 MRN: [id number] DOB: [birth year] PCP: Merri Brunette, MD  Time spent: 1:01 PM- 1:58 PM   Today I met with  Melissa [name] in remote video (Caregility) face-to-face individual psychotherapy.  Distance Site: Client's Home Orginating Site: Dr Odette Horns Remote Office Consent: Obtained verbal consent to transmit session remotely. Patient is aware of the limitations related to participating in virtual therapy    Reason for Visit /Presenting Problem:  Melissa [name] is a 67 year old MWF who was referred by her PCP.  At Christmas there was an event involving her daughter that precipitated a family "blow up."  They've only seen one another once since then.  Melissa [name] has been finding herself getting irritable and "short" with her husband and she thinks it's because she is so upset about the situation with her daughter.  Melissa [name] is struggling with what to do and is fearful that she will never be able to have a relationship with her daughter.     Mental Status Exam: Appearance:   Casual and Fairly Groomed     Behavior:  Appropriate  Motor:  Normal  Speech/Language:   NA  Affect:  Appropriate  Mood:  anxious, depressed, and irritable  Thought process:  normal  Thought content:    WNL  Sensory/Perceptual disturbances:    WNL  Orientation:  oriented to person, place, time/date, and situation  Attention:  Good  Concentration:  Good  Memory:  Fair  Fund of knowledge:   Good  Insight:    Good  Judgment:   Good  Impulse Control:  Good     Individualized Treatment [name]       Strengths: kind, helpful, cheerful, resourceful  Supports: Husband, son and friends   Goal/Needs for Treatment:  In order of importance to patient 1) Work on repairing relationship with daughter through the use of better communication and conflict management  skills, and a greater understanding of their family dynamics.  2) Learn and implement skills  and strategies to better manage depression.  3) Learn and implement skills and strategies to better manage anxiety  Goal 4) Grow in self understanding and become more resilient/self possessed in the face of criticism and other negative interactions   Client Statement of Needs: she would like to focus on working through an upsetting family problem, would like a deeper understanding of her reactions to criticism and how she perceives things negatively, wants to feel less anxious and depressed   Treatment Level: Outpatient Individual Psychotherapy  Symptoms:   Feels nervous, worries about different things, becomes irritable, low mood states, feels hopeless, loss of interest, sleep disruption (continuous), poor concentration, feels bad about herself  Client Treatment Preferences: would prefer in person but is also open to virtual appointments   Healthcare consumer's goal for treatment:  Psychologist, Melissa [name], [name] will support the patient's ability to achieve the goals identified. Cognitive Behavioral Therapy, Dialectical Behavioral Therapy, Motivational Interviewing, Behavior Activation and other evidenced-based practices will be used to promote progress towards healthy functioning.   Healthcare consumer Melissa [name] will: Actively participate in therapy, working towards healthy functioning.    *Justification for Continuation/Discontinuation of Goal: R=Revised, O=Ongoing, A=Achieved, D=Discontinued  Goal 1) Work on repairing relationship with daughter through the use of better communication and  conflict management skills, and a greater understanding of their family dynamics.  Likert rating baseline date: 03/17/2022 Target Date Goal Was reviewed Status Code Progress towards goal/Likert rating   03/18/2023  O               Goal 2)  Learn and implement skills and strategies to better manage depression.   Likert rating baseline date: 03/17/2022 Target Date Goal Was  reviewed Status Code Progress towards goal/Likert rating   03/18/2023           O              Goal 3) Learn and implement skills and strategies to better manage anxiety    Likert rating baseline date: 03/17/2022 Target Date Goal Was reviewed Status Code Progress towards goal/Likert rating   03/18/2023           O              Goal 4) Grow in self understanding and ability to self regulate during negative interactions   Likert rating baseline date: 03/17/2022 Target Date Goal Was reviewed Status Code Progress towards goal/Likert rating   03/18/2023           O              This [name] has been reviewed and created by the following participants:  This [name] will be reviewed at least every 12 months. Date Behavioral Health Clinician Date Guardian/Patient   03/17/2022 Melissa [name], [name]  03/17/2022 Melissa [name]                  Diagnosis:  Adjustment Disorder with mixed anxiety and depressed mood Generalized Anxiety Disorder  Melissa [name] reports that she met with her PCP was onboard about increasing her Lexapro to 10 mg and left it up to her about what she wanted to do about the Bupropion.  We d/ the while there is a warning that it may make some individuals anxious, this is not a common response.  Melissa [name] agreed to think about it some more and weigh out the benefits.  Melissa [name] states that she continues to have some difficulties saying "no: to her husband's many invitations to do things together.  We d/e/p what gets in the way, what she is saying to herself, identified the distortions in her thinking and how to counter these distortions.  I noted that there is a way to create space for herself, time together and it potential for improving her mood states.    Melissa Favors, PhD

## 2022-12-28 ENCOUNTER — Ambulatory Visit: Payer: PPO | Admitting: Psychology

## 2022-12-28 DIAGNOSIS — F411 Generalized anxiety disorder: Secondary | ICD-10-CM

## 2022-12-28 DIAGNOSIS — F4323 Adjustment disorder with mixed anxiety and depressed mood: Secondary | ICD-10-CM

## 2022-12-28 DIAGNOSIS — F330 Major depressive disorder, recurrent, mild: Secondary | ICD-10-CM

## 2022-12-28 NOTE — Progress Notes (Signed)
PROGRESS NOTE:  Name: Melissa [name] Date: 12/28/2022 MRN: [id number] DOB: March 21, 1955 PCP: Merri Brunette, MD  Time spent: 1:01 PM- 1:58 PM   Today I met with  Melissa [name] in remote video (Caregility) face-to-face individual psychotherapy.  Distance Site: Client's Home Orginating Site: Dr Odette Horns Remote Office Consent: Obtained verbal consent to transmit session remotely. Patient is aware of the limitations related to participating in virtual therapy    Reason for Visit /Presenting Problem:  Melissa [name] is a 67 year old MWF who was referred by her PCP.  At Christmas there was an event involving her daughter that precipitated a family "blow up."  They've only seen one another once since then.  Melissa [name] has been finding herself getting irritable and "short" with her husband and she thinks it's because she is so upset about the situation with her daughter.  Melissa [name] is struggling with what to do and is fearful that she will never be able to have a relationship with her daughter.     Mental Status Exam: Appearance:   Casual and Fairly Groomed     Behavior:  Appropriate  Motor:  Normal  Speech/Language:   NA  Affect:  Appropriate  Mood:  anxious, depressed, and irritable  Thought process:  normal  Thought content:    WNL  Sensory/Perceptual disturbances:    WNL  Orientation:  oriented to person, place, time/date, and situation  Attention:  Good  Concentration:  Good  Memory:  Fair  Fund of knowledge:   Good  Insight:    Good  Judgment:   Good  Impulse Control:  Good     Individualized Treatment [name]       Strengths: kind, helpful, cheerful, resourceful  Supports: Husband, son and friends   Goal/Needs for Treatment:  In order of importance to patient 1) Work on repairing relationship with daughter through the use of better communication and conflict management  skills, and a greater understanding of their family dynamics.  2) Learn and implement skills  and strategies to better manage depression.  3) Learn and implement skills and strategies to better manage anxiety  Goal 4) Grow in self understanding and become more resilient/self possessed in the face of criticism and other negative interactions   Client Statement of Needs: she would like to focus on working through an upsetting family problem, would like a deeper understanding of her reactions to criticism and how she perceives things negatively, wants to feel less anxious and depressed   Treatment Level: Outpatient Individual Psychotherapy  Symptoms:   Feels nervous, worries about different things, becomes irritable, low mood states, feels hopeless, loss of interest, sleep disruption (continuous), poor concentration, feels bad about herself  Client Treatment Preferences: would prefer in person but is also open to virtual appointments   Healthcare consumer's goal for treatment:  Psychologist, Melissa [name], [name] will support the patient's ability to achieve the goals identified. Cognitive Behavioral Therapy, Dialectical Behavioral Therapy, Motivational Interviewing, Behavior Activation and other evidenced-based practices will be used to promote progress towards healthy functioning.   Healthcare consumer Melissa [name] will: Actively participate in therapy, working towards healthy functioning.    *Justification for Continuation/Discontinuation of Goal: R=Revised, O=Ongoing, A=Achieved, D=Discontinued  Goal 1) Work on repairing relationship with daughter through the use of better communication and  conflict management skills, and a greater understanding of their family dynamics.  Likert rating baseline date: 03/17/2022 Target Date Goal Was reviewed Status Code Progress towards goal/Likert rating   03/18/2023  O               Goal 2)  Learn and implement skills and strategies to better manage depression.   Likert rating baseline date: 03/17/2022 Target Date Goal Was  reviewed Status Code Progress towards goal/Likert rating   03/18/2023           O              Goal 3) Learn and implement skills and strategies to better manage anxiety    Likert rating baseline date: 03/17/2022 Target Date Goal Was reviewed Status Code Progress towards goal/Likert rating   03/18/2023           O              Goal 4) Grow in self understanding and ability to self regulate during negative interactions   Likert rating baseline date: 03/17/2022 Target Date Goal Was reviewed Status Code Progress towards goal/Likert rating   03/18/2023           O              This [name] has been reviewed and created by the following participants:  This [name] will be reviewed at least every 12 months. Date Behavioral Health Clinician Date Guardian/Patient   03/17/2022 Melissa [name], [name]  03/17/2022 Melissa [name]                  Diagnosis:  Adjustment Disorder with mixed anxiety and depressed mood Generalized Anxiety Disorder  Melissa [name] reports that she was ready to give the Bupropion a try.  I answered her questions and she agreed to reach out to her PCP.    Melissa [name] and I d/e/p a number of issues and life changes related to her adult children.  Lastly, I encouraged her to allocate a day where she doesn't run errands and allows time for her crafting or other enjoyable activities.     Melissa Favors, PhD

## 2023-01-06 ENCOUNTER — Other Ambulatory Visit (HOSPITAL_COMMUNITY): Payer: Self-pay

## 2023-01-06 MED ORDER — BUPROPION HCL ER (XL) 150 MG PO TB24
150.0000 mg | ORAL_TABLET | Freq: Every morning | ORAL | 1 refills | Status: AC
  Filled 2023-01-06: qty 30, 30d supply, fill #0
  Filled 2023-02-02: qty 30, 30d supply, fill #1

## 2023-01-07 ENCOUNTER — Other Ambulatory Visit (HOSPITAL_COMMUNITY): Payer: Self-pay

## 2023-01-14 ENCOUNTER — Ambulatory Visit: Payer: PPO | Admitting: Psychology

## 2023-01-14 DIAGNOSIS — F411 Generalized anxiety disorder: Secondary | ICD-10-CM

## 2023-01-14 DIAGNOSIS — F4323 Adjustment disorder with mixed anxiety and depressed mood: Secondary | ICD-10-CM | POA: Diagnosis not present

## 2023-01-14 DIAGNOSIS — F330 Major depressive disorder, recurrent, mild: Secondary | ICD-10-CM

## 2023-01-14 NOTE — Progress Notes (Signed)
PROGRESS NOTE:  Name: Melissa [name] Date: 01/14/2023 MRN: [id number] DOB: [birth year] PCP: Melissa Brunette, MD  Time spent: 1:01 PM- 1:58 PM   Today I met in office with  Melissa [name] for in-person face-to-face individual psychotherapy.   Reason for Visit /Presenting Problem:  Melissa [name] is a 67 year old MWF who was referred by her PCP.  At Christmas there was an event involving her daughter that precipitated a family "blow up."  They've only seen one another once since then.  Melissa [name] has been finding herself getting irritable and "short" with her husband and she thinks it's because she is so upset about the situation with her daughter.  Melissa [name] is struggling with what to do and is fearful that she will never be able to have a relationship with her daughter.     Mental Status Exam: Appearance:   Casual and Fairly Groomed     Behavior:  Appropriate  Motor:  Normal  Speech/Language:   NA  Affect:  Appropriate  Mood:  anxious, depressed, and irritable  Thought process:  normal  Thought content:    WNL  Sensory/Perceptual disturbances:    WNL  Orientation:  oriented to person, place, time/date, and situation  Attention:  Good  Concentration:  Good  Memory:  Fair  Fund of knowledge:   Good  Insight:    Good  Judgment:   Good  Impulse Control:  Good     Individualized Treatment [name]       Strengths: kind, helpful, cheerful, resourceful  Supports: Husband, son and friends   Goal/Needs for Treatment:  In order of importance to patient 1) Work on repairing relationship with daughter through the use of better communication and conflict management  skills, and a greater understanding of their family dynamics.  2) Learn and implement skills and strategies to better manage depression.  3) Learn and implement skills and strategies to better manage anxiety  Goal 4) Grow in self understanding and become more resilient/self possessed in the face of criticism and  other negative interactions   Client Statement of Needs: she would like to focus on working through an upsetting family problem, would like a deeper understanding of her reactions to criticism and how she perceives things negatively, wants to feel less anxious and depressed   Treatment Level: Outpatient Individual Psychotherapy  Symptoms:   Feels nervous, worries about different things, becomes irritable, low mood states, feels hopeless, loss of interest, sleep disruption (continuous), poor concentration, feels bad about herself  Client Treatment Preferences: would prefer in person but is also open to virtual appointments   Healthcare consumer's goal for treatment:  Psychologist, Hilma Favors, [name] will support the patient's ability to achieve the goals identified. Cognitive Behavioral Therapy, Dialectical Behavioral Therapy, Motivational Interviewing, Behavior Activation and other evidenced-based practices will be used to promote progress towards healthy functioning.   Healthcare consumer Melissa [name] will: Actively participate in therapy, working towards healthy functioning.    *Justification for Continuation/Discontinuation of Goal: R=Revised, O=Ongoing, A=Achieved, D=Discontinued  Goal 1) Work on repairing relationship with daughter through the use of better communication and  conflict management skills, and a greater understanding of their family dynamics.  Likert rating baseline date: 03/17/2022 Target Date Goal Was reviewed Status Code Progress towards goal/Likert rating   03/18/2023            O               Goal 2)  Learn and implement skills  and strategies to better manage depression.   Likert rating baseline date: 03/17/2022 Target Date Goal Was reviewed Status Code Progress towards goal/Likert rating   03/18/2023           O              Goal 3) Learn and implement skills and strategies to better manage anxiety    Likert rating baseline date: 03/17/2022 Target Date  Goal Was reviewed Status Code Progress towards goal/Likert rating   03/18/2023           O              Goal 4) Grow in self understanding and ability to self regulate during negative interactions   Likert rating baseline date: 03/17/2022 Target Date Goal Was reviewed Status Code Progress towards goal/Likert rating   03/18/2023           O              This [name] has been reviewed and created by the following participants:  This [name] will be reviewed at least every 12 months. Date Behavioral Health Clinician Date Guardian/Patient   03/17/2022 Hilma Favors, [name]  03/17/2022 Melissa [name]                  Diagnosis:  Adjustment Disorder with mixed anxiety and depressed mood Generalized Anxiety Disorder  Melissa [name] reports that she started the Wellbutrin (150 mg) last week.  She has noticed that she has been more productive and we debated if it was an effect of the medication or whether it was the "pressure" of the holidays to get things done.  She has continued to assert herself more with her husband around certain situations.  Melissa [name] talked to her daughter about the holidays and became upset.  We d/e/p what occurred, how she felt, related family dynamics, and a [name] for how she will handle the holidays.   Hilma Favors, PhD

## 2023-02-04 ENCOUNTER — Other Ambulatory Visit (HOSPITAL_COMMUNITY): Payer: Self-pay

## 2023-02-08 ENCOUNTER — Other Ambulatory Visit: Payer: Self-pay

## 2023-02-09 ENCOUNTER — Ambulatory Visit: Payer: PPO | Admitting: Psychology

## 2023-02-09 DIAGNOSIS — F330 Major depressive disorder, recurrent, mild: Secondary | ICD-10-CM

## 2023-02-09 DIAGNOSIS — F411 Generalized anxiety disorder: Secondary | ICD-10-CM | POA: Diagnosis not present

## 2023-02-09 DIAGNOSIS — F4323 Adjustment disorder with mixed anxiety and depressed mood: Secondary | ICD-10-CM | POA: Diagnosis not present

## 2023-02-09 NOTE — Progress Notes (Signed)
 PROGRESS NOTE:  Name: Melissa [name] Date: 02/09/2023 MRN: [id number] DOB: 1955/04/04 PCP: Claudene Pellet, MD  Time spent: 8:01 AM- 8:58 AM   Today I met in office with  Melissa [name] for in-person face-to-face individual psychotherapy.   Reason for Visit /Presenting Problem:  Melissa [name] is a 68 year old MWF who was referred by her PCP.  At Christmas there was an event involving her daughter that precipitated a family blow up.  They've only seen one another once since then.  Melissa has been finding herself getting irritable and short with her husband and she thinks it's because she is so upset about the situation with her daughter.  Melissa is struggling with what to do and is fearful that she will never be able to have a relationship with her daughter.     Mental Status Exam: Appearance:   Casual and Fairly Groomed     Behavior:  Appropriate  Motor:  Normal  Speech/Language:   NA  Affect:  Appropriate  Mood:  anxious, depressed, and irritable  Thought process:  normal  Thought content:    WNL  Sensory/Perceptual disturbances:    WNL  Orientation:  oriented to person, place, time/date, and situation  Attention:  Good  Concentration:  Good  Memory:  Fair  Fund of knowledge:   Good  Insight:    Good  Judgment:   Good  Impulse Control:  Good     Individualized Treatment Plan       Strengths: kind, helpful, cheerful, resourceful  Supports: Husband, son and friends   Goal/Needs for Treatment:  In order of importance to patient 1) Work on repairing relationship with daughter through the use of better communication and conflict management  skills, and a greater understanding of their family dynamics.  2) Learn and implement skills and strategies to better manage depression.  3) Learn and implement skills and strategies to better manage anxiety  Goal 4) Grow in self understanding and become more resilient/self possessed in the face of criticism and other  negative interactions   Client Statement of Needs: she would like to focus on working through an upsetting family problem, would like a deeper understanding of her reactions to criticism and how she perceives things negatively, wants to feel less anxious and depressed   Treatment Level: Outpatient Individual Psychotherapy  Symptoms:   Feels nervous, worries about different things, becomes irritable, low mood states, feels hopeless, loss of interest, sleep disruption (continuous), poor concentration, feels bad about herself  Client Treatment Preferences: would prefer in person but is also open to virtual appointments   Healthcare consumer's goal for treatment:  Psychologist, [name], [name] will support the patient's ability to achieve the goals identified. Cognitive Behavioral Therapy, Dialectical Behavioral Therapy, Motivational Interviewing, Behavior Activation and other evidenced-based practices will be used to promote progress towards healthy functioning.   Healthcare consumer Melissa [name] will: Actively participate in therapy, working towards healthy functioning.    *Justification for Continuation/Discontinuation of Goal: R=Revised, O=Ongoing, A=Achieved, D=Discontinued  Goal 1) Work on repairing relationship with daughter through the use of better communication and  conflict management skills, and a greater understanding of their family dynamics.  Likert rating baseline date: 03/17/2022 Target Date Goal Was reviewed Status Code Progress towards goal/Likert rating   03/18/2023            O               Goal 2)  Learn and implement skills  and strategies to better manage depression.   Likert rating baseline date: 03/17/2022 Target Date Goal Was reviewed Status Code Progress towards goal/Likert rating   03/18/2023           O              Goal 3) Learn and implement skills and strategies to better manage anxiety    Likert rating baseline date: 03/17/2022 Target Date Goal  Was reviewed Status Code Progress towards goal/Likert rating   03/18/2023           O              Goal 4) Grow in self understanding and ability to self regulate during negative interactions   Likert rating baseline date: 03/17/2022 Target Date Goal Was reviewed Status Code Progress towards goal/Likert rating   03/18/2023           O              This plan has been reviewed and created by the following participants:  This plan will be reviewed at least every 12 months. Date Behavioral Health Clinician Date Guardian/Patient   03/17/2022 [name], [name]  03/17/2022 Melissa [name]                  Diagnosis:  Adjustment Disorder with mixed anxiety and depressed mood Generalized Anxiety Disorder  Melissa reports that Christmas went well.  We d/e/p the events as they transpired, f/t on conversation with her daughter, and hoping for a shift in their relationship dynamics.  Lastly, we d/ her upcoming trip to [place], anticipated challenges, and ways to pre-empt this problems.    We agreed to review how she was doing with the Wellbutrin  once the stress of the holidays and travel were behind her.   [name], PhD

## 2023-03-04 ENCOUNTER — Ambulatory Visit: Payer: PPO | Admitting: Psychology

## 2023-03-04 DIAGNOSIS — F4323 Adjustment disorder with mixed anxiety and depressed mood: Secondary | ICD-10-CM

## 2023-03-04 DIAGNOSIS — F330 Major depressive disorder, recurrent, mild: Secondary | ICD-10-CM

## 2023-03-04 NOTE — Progress Notes (Signed)
PROGRESS NOTE:  Name: Melissa [name] Date: 03/04/2023 MRN: [id number] DOB: 08/18/1955 PCP: Merri Brunette, MD  Time spent: 10:01 AM- 10:58 AM   Today I met in office with  Melissa [name] for in-person face-to-face individual psychotherapy.   Reason for Visit /Presenting Problem:  Melissa [name] is a 68 year old MWF who was referred by her PCP.  At Christmas there was an event involving her daughter that precipitated a family "blow up."  They've only seen one another once since then.  Melissa [name] has been finding herself getting irritable and "short" with her husband and she thinks it's because she is so upset about the situation with her daughter.  Melissa [name] is struggling with what to do and is fearful that she will never be able to have a relationship with her daughter.     Mental Status Exam: Appearance:   Casual and Fairly Groomed     Behavior:  Appropriate  Motor:  Normal  Speech/Language:   NA  Affect:  Appropriate  Mood:  anxious, depressed, and irritable  Thought process:  normal  Thought content:    WNL  Sensory/Perceptual disturbances:    WNL  Orientation:  oriented to person, place, time/date, and situation  Attention:  Good  Concentration:  Good  Memory:  Fair  Fund of knowledge:   Good  Insight:    Good  Judgment:   Good  Impulse Control:  Good     Individualized Treatment [name]       Strengths: kind, helpful, cheerful, resourceful  Supports: Husband, son and friends   Goal/Needs for Treatment:  In order of importance to patient 1) Work on repairing relationship with daughter through the use of better communication and conflict management  skills, and a greater understanding of their family dynamics.  2) Learn and implement skills and strategies to better manage depression.  3) Learn and implement skills and strategies to better manage anxiety  Goal 4) Grow in self understanding and become more resilient/self possessed in the face of criticism and  other negative interactions   Client Statement of Needs: she would like to focus on working through an upsetting family problem, would like a deeper understanding of her reactions to criticism and how she perceives things negatively, wants to feel less anxious and depressed   Treatment Level: Outpatient Individual Psychotherapy  Symptoms:   Feels nervous, worries about different things, becomes irritable, low mood states, feels hopeless, loss of interest, sleep disruption (continuous), poor concentration, feels bad about herself  Client Treatment Preferences: would prefer in person but is also open to virtual appointments   Healthcare consumer's goal for treatment:  Psychologist, Melissa [name], [name] will support the patient's ability to achieve the goals identified. Cognitive Behavioral Therapy, Dialectical Behavioral Therapy, Motivational Interviewing, Behavior Activation and other evidenced-based practices will be used to promote progress towards healthy functioning.   Healthcare consumer Melissa [name] will: Actively participate in therapy, working towards healthy functioning.    *Justification for Continuation/Discontinuation of Goal: R=Revised, O=Ongoing, A=Achieved, D=Discontinued  Goal 1) Work on repairing relationship with daughter through the use of better communication and  conflict management skills, and a greater understanding of their family dynamics.  Likert rating baseline date: 03/17/2022 Target Date Goal Was reviewed Status Code Progress towards goal/Likert rating   03/18/2023            O               Goal 2)  Learn and implement skills  and strategies to better manage depression.   Likert rating baseline date: 03/17/2022 Target Date Goal Was reviewed Status Code Progress towards goal/Likert rating   03/18/2023           O              Goal 3) Learn and implement skills and strategies to better manage anxiety    Likert rating baseline date: 03/17/2022 Target Date  Goal Was reviewed Status Code Progress towards goal/Likert rating   03/18/2023           O              Goal 4) Grow in self understanding and ability to self regulate during negative interactions   Likert rating baseline date: 03/17/2022 Target Date Goal Was reviewed Status Code Progress towards goal/Likert rating   03/18/2023           O              This [name] has been reviewed and created by the following participants:  This [name] will be reviewed at least every 12 months. Date Behavioral Health Clinician Date Guardian/Patient   03/17/2022 Melissa [name], [name]  03/17/2022 Melissa [name]                  Diagnosis:  Adjustment Disorder with mixed anxiety and depressed mood Generalized Anxiety Disorder  Melissa [name] reports that their trip to [place] went very well.  We d/p the anxieties she anticipated and how they didn't play out, what she's learned  from the experience, and the challenges she faced.  Melissa [name] shared a situation that occurred related to her husband's behavior that upset and irritated her.  We d/e/p a reoccurring pattern of behavior, how it leaves her feeling, and how to convey how she is affected more clearly.  I gave Melissa [name] a couple of prompt to journal on as she considers what's most important to communicate.  We agreed to review how she was doing with the Wellbutrin once the stress of the holidays and travel were behind her.   Home Practice: journal on prompts provided in session  Melissa Favors, PhD

## 2023-03-05 ENCOUNTER — Other Ambulatory Visit (HOSPITAL_COMMUNITY): Payer: Self-pay

## 2023-03-08 ENCOUNTER — Other Ambulatory Visit (HOSPITAL_COMMUNITY): Payer: Self-pay

## 2023-03-09 ENCOUNTER — Other Ambulatory Visit (HOSPITAL_COMMUNITY): Payer: Self-pay

## 2023-03-09 MED ORDER — BUPROPION HCL ER (XL) 150 MG PO TB24
150.0000 mg | ORAL_TABLET | Freq: Every morning | ORAL | 0 refills | Status: DC
Start: 2023-03-09 — End: 2023-03-11
  Filled 2023-03-09: qty 30, 30d supply, fill #0

## 2023-03-11 ENCOUNTER — Other Ambulatory Visit (HOSPITAL_COMMUNITY): Payer: Self-pay

## 2023-03-11 MED ORDER — BUPROPION HCL ER (XL) 150 MG PO TB24
150.0000 mg | ORAL_TABLET | Freq: Every morning | ORAL | 1 refills | Status: AC
  Filled 2023-03-11 – 2023-04-02 (×2): qty 90, 90d supply, fill #0
  Filled 2023-06-10: qty 90, 90d supply, fill #1

## 2023-03-12 ENCOUNTER — Other Ambulatory Visit (HOSPITAL_COMMUNITY): Payer: Self-pay

## 2023-03-17 ENCOUNTER — Ambulatory Visit: Payer: PPO | Admitting: Psychology

## 2023-03-17 ENCOUNTER — Other Ambulatory Visit (HOSPITAL_COMMUNITY): Payer: Self-pay

## 2023-03-17 DIAGNOSIS — F330 Major depressive disorder, recurrent, mild: Secondary | ICD-10-CM

## 2023-03-17 DIAGNOSIS — F4323 Adjustment disorder with mixed anxiety and depressed mood: Secondary | ICD-10-CM | POA: Diagnosis not present

## 2023-03-17 MED ORDER — LOSARTAN POTASSIUM 25 MG PO TABS
ORAL_TABLET | Freq: Every day | ORAL | 2 refills | Status: DC
  Filled 2023-03-17: qty 30, 30d supply, fill #0
  Filled 2023-04-02: qty 30, 30d supply, fill #1
  Filled 2023-05-14: qty 30, 30d supply, fill #2

## 2023-03-17 NOTE — Progress Notes (Signed)
PROGRESS NOTE:  Name: Melissa [name] Date: 03/17/2023 MRN: [id number] DOB: Aug 23, 1955 PCP: Merri Brunette, MD  Time spent: 10:01 AM- 10:58 AM   Today I met in office with  Melissa [name] for in-person face-to-face individual psychotherapy.   Reason for Visit /Presenting Problem:  Melissa [name] is a 68 year old MWF who was referred by her PCP.  At Christmas there was an event involving her daughter that precipitated a family "blow up."  They've only seen one another once since then.  Melissa [name] has been finding herself getting irritable and "short" with her husband and she thinks it's because she is so upset about the situation with her daughter.  Melissa [name] is struggling with what to do and is fearful that she will never be able to have a relationship with her daughter.     Mental Status Exam: Appearance:   Casual and Fairly Groomed     Behavior:  Appropriate  Motor:  Normal  Speech/Language:   NA  Affect:  Appropriate  Mood:  anxious, depressed, and irritable  Thought process:  normal  Thought content:    WNL  Sensory/Perceptual disturbances:    WNL  Orientation:  oriented to person, place, time/date, and situation  Attention:  Good  Concentration:  Good  Memory:  Fair  Fund of knowledge:   Good  Insight:    Good  Judgment:   Good  Impulse Control:  Good     Individualized Treatment Plan       Strengths: kind, helpful, cheerful, resourceful  Supports: Husband, son and friends   Goal/Needs for Treatment:  In order of importance to patient 1) Work on repairing relationship with daughter through the use of better communication and conflict management  skills, and a greater understanding of their family dynamics.  2) Learn and implement skills and strategies to better manage depression.  3) Learn and implement skills and strategies to better manage anxiety  Goal 4) Grow in self understanding and become more resilient/self possessed in the face of criticism and  other negative interactions   Client Statement of Needs: she would like to focus on working through an upsetting family problem, would like a deeper understanding of her reactions to criticism and how she perceives things negatively, wants to feel less anxious and depressed   Treatment Level: Outpatient Individual Psychotherapy  Symptoms:   Feels nervous, worries about different things, becomes irritable, low mood states, feels hopeless, loss of interest, sleep disruption (continuous), poor concentration, feels bad about herself  Client Treatment Preferences: would prefer in person but is also open to virtual appointments   Healthcare consumer's goal for treatment:  Psychologist, Hilma Favors, [name] will support the patient's ability to achieve the goals identified. Cognitive Behavioral Therapy, Dialectical Behavioral Therapy, Motivational Interviewing, Behavior Activation and other evidenced-based practices will be used to promote progress towards healthy functioning.   Healthcare consumer Makenzi Bannister will: Actively participate in therapy, working towards healthy functioning.    *Justification for Continuation/Discontinuation of Goal: R=Revised, O=Ongoing, A=Achieved, D=Discontinued  Goal 1) Work on repairing relationship with daughter through the use of better communication and  conflict management skills, and a greater understanding of their family dynamics.  Likert rating baseline date: 03/17/2022 Target Date Goal Was reviewed Status Code Progress towards goal/Likert rating   03/18/2023            O               Goal 2)  Learn and implement skills  and strategies to better manage depression.   Likert rating baseline date: 03/17/2022 Target Date Goal Was reviewed Status Code Progress towards goal/Likert rating   03/18/2023           O              Goal 3) Learn and implement skills and strategies to better manage anxiety    Likert rating baseline date: 03/17/2022 Target Date  Goal Was reviewed Status Code Progress towards goal/Likert rating   03/18/2023           O              Goal 4) Grow in self understanding and ability to self regulate during negative interactions   Likert rating baseline date: 03/17/2022 Target Date Goal Was reviewed Status Code Progress towards goal/Likert rating   03/18/2023           O              This plan has been reviewed and created by the following participants:  This plan will be reviewed at least every 12 months. Date Behavioral Health Clinician Date Guardian/Patient   03/17/2022 Hilma Favors, [name]  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  While it was not what she expected, she has decided to stick it out.  I praised her for her efforts, for trying something very new and for having the curiosity to explore new things.  We agreed to review how she was doing with the Wellbutrin (150mg ) and renewed her prescription.  She has noticed that she is reacting differently than she would in the past.  She states she is more motivated and more productive.    Home Practice: journal on prompts provided in session  Hilma Favors, PhD

## 2023-03-24 ENCOUNTER — Other Ambulatory Visit (HOSPITAL_COMMUNITY): Payer: Self-pay

## 2023-04-01 ENCOUNTER — Ambulatory Visit: Payer: PPO | Admitting: Psychology

## 2023-04-02 ENCOUNTER — Other Ambulatory Visit (HOSPITAL_COMMUNITY): Payer: Self-pay

## 2023-04-05 ENCOUNTER — Ambulatory Visit: Payer: PPO | Admitting: Psychology

## 2023-04-05 DIAGNOSIS — F330 Major depressive disorder, recurrent, mild: Secondary | ICD-10-CM

## 2023-04-05 DIAGNOSIS — F4323 Adjustment disorder with mixed anxiety and depressed mood: Secondary | ICD-10-CM

## 2023-04-05 NOTE — Progress Notes (Signed)
 PROGRESS NOTE:  Name: Melissa [name] Date: 04/05/2023 MRN: [id number] DOB: [birth year] PCP: Merri Brunette, MD  Time spent: 12:01 PM- 12:58 PM   Today I met in office with  [name] for in-person face-to-face individual psychotherapy.   Reason for Visit /Presenting Problem:  Melissa [name] is a 68 year old MWF who was referred by her PCP.  At Christmas there was an event involving her daughter that precipitated a family "blow up."  They've only seen one another once since then.  Melissa [name] has been finding herself getting irritable and "short" with her husband and she thinks it's because she is so upset about the situation with her daughter.  Melissa [name] is struggling with what to do and is fearful that she will never be able to have a relationship with her daughter.     Mental Status Exam: Appearance:   Casual and Fairly Groomed     Behavior:  Appropriate  Motor:  Normal  Speech/Language:   NA  Affect:  Appropriate  Mood:  anxious, depressed, and irritable  Thought process:  normal  Thought content:    WNL  Sensory/Perceptual disturbances:    WNL  Orientation:  oriented to person, place, time/date, and situation  Attention:  Good  Concentration:  Good  Memory:  Fair  Fund of knowledge:   Good  Insight:    Good  Judgment:   Good  Impulse Control:  Good     Individualized Treatment Plan       Strengths: kind, helpful, cheerful, resourceful  Supports: Husband, son and friends   Goal/Needs for Treatment:  In order of importance to patient 1) Work on repairing relationship with daughter through the use of better communication and conflict management  skills, and a greater understanding of their family dynamics.  2) Learn and implement skills and strategies to better manage depression.  3) Learn and implement skills and strategies to better manage anxiety  Goal 4) Grow in self understanding and become more resilient/self possessed in the face of criticism and  other negative interactions   Client Statement of Needs: she would like to focus on working through an upsetting family problem, would like a deeper understanding of her reactions to criticism and how she perceives things negatively, wants to feel less anxious and depressed   Treatment Level: Outpatient Individual Psychotherapy  Symptoms:   Feels nervous, worries about different things, becomes irritable, low mood states, feels hopeless, loss of interest, sleep disruption (continuous), poor concentration, feels bad about herself  Client Treatment Preferences: would prefer in person but is also open to virtual appointments   Healthcare consumer's goal for treatment:  Psychologist, Hilma Favors, [name] will support the patient's ability to achieve the goals identified. Cognitive Behavioral Therapy, Dialectical Behavioral Therapy, Motivational Interviewing, Behavior Activation and other evidenced-based practices will be used to promote progress towards healthy functioning.   Healthcare consumer Jamonica Schoff will: Actively participate in therapy, working towards healthy functioning.    *Justification for Continuation/Discontinuation of Goal: R=Revised, O=Ongoing, A=Achieved, D=Discontinued  Goal 1) Work on repairing relationship with daughter through the use of better communication and  conflict management skills, and a greater understanding of their family dynamics.  Likert rating baseline date: 03/17/2022 Target Date Goal Was reviewed Status Code Progress towards goal/Likert rating   04/15/2023            O               Goal 2)  Learn and implement skills  and strategies to better manage depression.   Likert rating baseline date: 03/17/2022 Target Date Goal Was reviewed Status Code Progress towards goal/Likert rating   04/15/2023           O              Goal 3) Learn and implement skills and strategies to better manage anxiety    Likert rating baseline date: 03/17/2022 Target Date  Goal Was reviewed Status Code Progress towards goal/Likert rating   04/15/2023           O              Goal 4) Grow in self understanding and ability to self regulate during negative interactions   Likert rating baseline date: 03/17/2022 Target Date Goal Was reviewed Status Code Progress towards goal/Likert rating   04/15/2023           O              This plan has been reviewed and created by the following participants:  This plan will be reviewed at least every 12 months. Date Behavioral Health Clinician Date Guardian/Patient   03/17/2022 Hilma Favors, [name]  03/17/2022 [facility] Plan                  Diagnosis:  Adjustment Disorder with mixed anxiety and depressed mood Generalized Anxiety Disorder  Melissa [name] reports that she had a few days this past week when she felt off.  We d/e/p possible contributing factors, and uncovered some early connections between the past and present behaviors and mood states.  Home Practice: journal on prompts provided in session  Hilma Favors, PhD

## 2023-04-13 ENCOUNTER — Other Ambulatory Visit (HOSPITAL_COMMUNITY): Payer: Self-pay

## 2023-04-29 ENCOUNTER — Ambulatory Visit: Admitting: Psychology

## 2023-04-29 DIAGNOSIS — F330 Major depressive disorder, recurrent, mild: Secondary | ICD-10-CM

## 2023-04-29 DIAGNOSIS — F4323 Adjustment disorder with mixed anxiety and depressed mood: Secondary | ICD-10-CM | POA: Diagnosis not present

## 2023-04-29 NOTE — Progress Notes (Addendum)
 PROGRESS NOTE:  Name: Melissa [name] Date: 04/29/2023 MRN: [id number] DOB: [birth year] PCP: Merri Brunette, MD  Time spent: 3:01 PM- 3:58 PM   Today I met in office with  Melissa [name] for in-person face-to-face individual psychotherapy.   Reason for Visit /Presenting Problem:  Melissa [name] is a 68 year old MWF who was referred by her PCP.  At Christmas there was an event involving her daughter that precipitated a family "blow up."  They've only seen one another once since then.  Melissa [name] has been finding herself getting irritable and "short" with her husband and she thinks it's because she is so upset about the situation with her daughter.  Melissa [name] is struggling with what to do and is fearful that she will never be able to have a relationship with her daughter.     Mental Status Exam: Appearance:   Casual and Fairly Groomed     Behavior:  Appropriate  Motor:  Normal  Speech/Language:   NA  Affect:  Appropriate  Mood:  anxious, depressed, and irritable  Thought process:  normal  Thought content:    WNL  Sensory/Perceptual disturbances:    WNL  Orientation:  oriented to person, place, time/date, and situation  Attention:  Good  Concentration:  Good  Memory:  Fair  Fund of knowledge:   Good  Insight:    Good  Judgment:   Good  Impulse Control:  Good     Individualized Treatment [name]       Strengths: kind, helpful, cheerful, resourceful  Supports: Husband, son and friends   Goal/Needs for Treatment:  In order of importance to patient 1) Work on repairing relationship with daughter through the use of better communication and conflict management  skills, and a greater understanding of their family dynamics.  2) Learn and implement skills and strategies to better manage depression.  3) Learn and implement skills and strategies to better manage anxiety  Goal 4) Grow in self understanding and become more resilient/self possessed in the face of criticism and  other negative interactions   Client Statement of Needs: she would like to focus on working through an upsetting family problem, would like a deeper understanding of her reactions to criticism and how she perceives things negatively, wants to feel less anxious and depressed   Treatment Level: Outpatient Individual Psychotherapy  Symptoms:   Feels nervous, worries about different things, becomes irritable, low mood states, feels hopeless, loss of interest, sleep disruption (continuous), poor concentration, feels bad about herself  Client Treatment Preferences: would prefer in person but is also open to virtual appointments   Healthcare consumer's goal for treatment:  Psychologist, Hilma Favors, [name] will support the patient's ability to achieve the goals identified. Cognitive Behavioral Therapy, Dialectical Behavioral Therapy, Motivational Interviewing, Behavior Activation and other evidenced-based practices will be used to promote progress towards healthy functioning.   Healthcare consumer Melissa [name] will: Actively participate in therapy, working towards healthy functioning.    *Justification for Continuation/Discontinuation of Goal: R=Revised, O=Ongoing, A=Achieved, D=Discontinued  Goal 1) Work on repairing relationship with daughter through the use of better communication and  conflict management skills, and a greater understanding of their family dynamics.  Likert rating baseline date: 03/17/2022 Target Date Goal Was reviewed Status Code Progress towards goal/Likert rating   05/16/2023            O               Goal 2)  Learn and implement skills  and strategies to better manage depression.   Likert rating baseline date: 03/17/2022 Target Date Goal Was reviewed Status Code Progress towards goal/Likert rating   05/16/2023           O              Goal 3) Learn and implement skills and strategies to better manage anxiety    Likert rating baseline date: 03/17/2022 Target Date  Goal Was reviewed Status Code Progress towards goal/Likert rating   05/16/2023           O              Goal 4) Grow in self understanding and ability to self regulate during negative interactions   Likert rating baseline date: 03/17/2022 Target Date Goal Was reviewed Status Code Progress towards goal/Likert rating   05/16/2023           O              This [name] has been reviewed and created by the following participants:  This [name] will be reviewed at least every 12 months. Date Behavioral Health Clinician Date Guardian/Patient   03/17/2022 Hilma Favors, [name]  03/17/2022 Melissa [name]                  Diagnosis:  Adjustment Disorder with mixed anxiety and depressed mood Generalized Anxiety Disorder  Melissa [name] reports that she had a nice vacation, and her worries about her in-laws were unfounded.  She continues to enjoy her french class, but is frustrated that she isn't retaining information at the level she likes.  Melissa [name] also shared that she was cutting back on alcohol.  We d/ why she feels like she wants/needs to cut back.  I offered psycho education around the impact of drinking on sleep (disruption) and memory.  While she continues to experience anxiety, Melissa [name] appears to be managing her anxiety more consistently and effectively.  After some d/ we agreed to reduce her session to once a month.  Melissa [name] knows she can reach out at any time should she need additional support.  Home Practice: journal on prompts provided in session.   Hilma Favors, PhD

## 2023-05-14 ENCOUNTER — Other Ambulatory Visit (HOSPITAL_COMMUNITY): Payer: Self-pay

## 2023-05-27 ENCOUNTER — Other Ambulatory Visit (HOSPITAL_COMMUNITY): Payer: Self-pay

## 2023-05-27 ENCOUNTER — Ambulatory Visit: Admitting: Psychology

## 2023-05-27 DIAGNOSIS — F4323 Adjustment disorder with mixed anxiety and depressed mood: Secondary | ICD-10-CM | POA: Diagnosis not present

## 2023-05-27 DIAGNOSIS — F411 Generalized anxiety disorder: Secondary | ICD-10-CM | POA: Diagnosis not present

## 2023-05-27 MED ORDER — ESCITALOPRAM OXALATE 10 MG PO TABS
10.0000 mg | ORAL_TABLET | Freq: Every day | ORAL | 1 refills | Status: AC
  Filled 2023-05-27: qty 90, 90d supply, fill #0

## 2023-05-27 NOTE — Progress Notes (Addendum)
 PROGRESS NOTE:  Name: Melissa [name] Date: 05/27/2023 MRN: [id number] DOB: 11/02/1955 PCP: Faustina Hood, MD  Time spent: 9:01 AM- 9:58 AM   Today I met in office with  Melissa [name] for in-person face-to-face individual psychotherapy.   Reason for Visit /Presenting Problem:  Melissa [name] is a 68 year old MWF who was referred by her PCP.  At Christmas there was an event involving her daughter that precipitated a family "blow up."  They've only seen one another once since then.  Melissa [name] has been finding herself getting irritable and "short" with her husband and she thinks it's because she is so upset about the situation with her daughter.  Melissa [name] is struggling with what to do and is fearful that she will never be able to have a relationship with her daughter.     Mental Status Exam: Appearance:   Casual and Fairly Groomed     Behavior:  Appropriate  Motor:  Normal  Speech/Language:   NA  Affect:  Appropriate  Mood:  anxious, depressed, and irritable  Thought process:  normal  Thought content:    WNL  Sensory/Perceptual disturbances:    WNL  Orientation:  oriented to person, place, time/date, and situation  Attention:  Good  Concentration:  Good  Memory:  Fair  Fund of knowledge:   Good  Insight:    Good  Judgment:   Good  Impulse Control:  Good     Individualized Treatment Plan       Strengths: kind, helpful, cheerful, resourceful  Supports: Husband, son and friends   Goal/Needs for Treatment:  In order of importance to patient 1) Work on repairing relationship with daughter through the use of better communication and conflict management  skills, and a greater understanding of their family dynamics.  2) Learn and implement skills and strategies to better manage depression.  3) Learn and implement skills and strategies to better manage anxiety  Goal 4) Grow in self understanding and become more resilient/self possessed in the face of criticism and  other negative interactions   Client Statement of Needs: she would like to focus on working through an upsetting family problem, would like a deeper understanding of her reactions to criticism and how she perceives things negatively, wants to feel less anxious and depressed   Treatment Level: Outpatient Individual Psychotherapy  Symptoms:   Feels nervous, worries about different things, becomes irritable, low mood states, feels hopeless, loss of interest, sleep disruption (continuous), poor concentration, feels bad about herself  Client Treatment Preferences: would prefer in person but is also open to virtual appointments   Healthcare consumer's goal for treatment:  Psychologist, Melissa [name], [name] will support the patient's ability to achieve the goals identified. Cognitive Behavioral Therapy, Dialectical Behavioral Therapy, Motivational Interviewing, Behavior Activation and other evidenced-based practices will be used to promote progress towards healthy functioning.   Healthcare consumer Melissa [name] will: Actively participate in therapy, working towards healthy functioning.    *Justification for Continuation/Discontinuation of Goal: R=Revised, O=Ongoing, A=Achieved, D=Discontinued  Goal 1) Work on repairing relationship with daughter through the use of better communication and  conflict management skills, and a greater understanding of their family dynamics.  Likert rating baseline date: 03/17/2022 Target Date Goal Was reviewed Status Code Progress towards goal/Likert rating    06/15/2023            O               Goal 2)  Learn and implement  skills and strategies to better manage depression.   Likert rating baseline date: 03/17/2022 Target Date Goal Was reviewed Status Code Progress towards goal/Likert rating   06/15/2023           O              Goal 3) Learn and implement skills and strategies to better manage anxiety    Likert rating baseline date: 03/17/2022 Target Date  Goal Was reviewed Status Code Progress towards goal/Likert rating   06/15/2023           O              Goal 4) Grow in self understanding and ability to self regulate during negative interactions   Likert rating baseline date: 03/17/2022 Target Date Goal Was reviewed Status Code Progress towards goal/Likert rating   06/15/2023           O              This plan has been reviewed and created by the following participants:  This plan will be reviewed at least every 12 months. Date Behavioral Health Clinician Date Guardian/Patient   03/17/2022 Melissa [name], [name]  03/17/2022 Melissa [name]                  Diagnosis:  Adjustment Disorder with mixed anxiety and depressed mood Generalized Anxiety Disorder   Melissa [name] reports that she has been enjoying her french class, is feeling challenged, and frustrated all at the same time.  We d/p what she gains from learning french, her husband's mixed feelings, and the encouragement she received from her professor.  We d/p a positive interaction with her daughter and granddaughter, reflections about her own mother, and how to break with old family patterns.  Lastly, we d/e/p that she was not looking forward to spending six weeks away from home, her reticence to go, and how she can improve on the situation.   Home Practice: journal on prompts provided in session.    Melissa Greening, PhD

## 2023-06-10 ENCOUNTER — Other Ambulatory Visit (HOSPITAL_COMMUNITY): Payer: Self-pay

## 2023-06-11 ENCOUNTER — Other Ambulatory Visit (HOSPITAL_COMMUNITY): Payer: Self-pay

## 2023-06-11 MED ORDER — LOSARTAN POTASSIUM 25 MG PO TABS
25.0000 mg | ORAL_TABLET | Freq: Every day | ORAL | 0 refills | Status: DC
Start: 2023-06-11 — End: 2023-07-12
  Filled 2023-06-11: qty 30, 30d supply, fill #0

## 2023-06-12 ENCOUNTER — Other Ambulatory Visit (HOSPITAL_COMMUNITY): Payer: Self-pay

## 2023-06-15 ENCOUNTER — Other Ambulatory Visit (HOSPITAL_COMMUNITY): Payer: Self-pay

## 2023-06-15 DIAGNOSIS — L821 Other seborrheic keratosis: Secondary | ICD-10-CM | POA: Diagnosis not present

## 2023-06-15 DIAGNOSIS — L570 Actinic keratosis: Secondary | ICD-10-CM | POA: Diagnosis not present

## 2023-06-15 DIAGNOSIS — L820 Inflamed seborrheic keratosis: Secondary | ICD-10-CM | POA: Diagnosis not present

## 2023-06-15 DIAGNOSIS — Z85828 Personal history of other malignant neoplasm of skin: Secondary | ICD-10-CM | POA: Diagnosis not present

## 2023-06-15 DIAGNOSIS — L814 Other melanin hyperpigmentation: Secondary | ICD-10-CM | POA: Diagnosis not present

## 2023-06-15 DIAGNOSIS — L905 Scar conditions and fibrosis of skin: Secondary | ICD-10-CM | POA: Diagnosis not present

## 2023-06-15 MED ORDER — IMIQUIMOD 5 % EX CREA
TOPICAL_CREAM | CUTANEOUS | 0 refills | Status: AC
  Filled 2023-06-15: qty 8, 14d supply, fill #0

## 2023-06-17 ENCOUNTER — Other Ambulatory Visit (HOSPITAL_COMMUNITY): Payer: Self-pay

## 2023-06-21 ENCOUNTER — Encounter (INDEPENDENT_AMBULATORY_CARE_PROVIDER_SITE_OTHER): Payer: Self-pay | Admitting: Otolaryngology

## 2023-06-21 ENCOUNTER — Ambulatory Visit (INDEPENDENT_AMBULATORY_CARE_PROVIDER_SITE_OTHER): Admitting: Otolaryngology

## 2023-06-21 VITALS — BP 148/83 | HR 72

## 2023-06-21 DIAGNOSIS — H6123 Impacted cerumen, bilateral: Secondary | ICD-10-CM

## 2023-06-21 DIAGNOSIS — H6980 Other specified disorders of Eustachian tube, unspecified ear: Secondary | ICD-10-CM | POA: Diagnosis not present

## 2023-06-21 DIAGNOSIS — J310 Chronic rhinitis: Secondary | ICD-10-CM

## 2023-06-21 DIAGNOSIS — R0981 Nasal congestion: Secondary | ICD-10-CM

## 2023-06-21 DIAGNOSIS — H9313 Tinnitus, bilateral: Secondary | ICD-10-CM

## 2023-06-21 DIAGNOSIS — H903 Sensorineural hearing loss, bilateral: Secondary | ICD-10-CM

## 2023-06-21 DIAGNOSIS — H6983 Other specified disorders of Eustachian tube, bilateral: Secondary | ICD-10-CM

## 2023-06-21 NOTE — Progress Notes (Signed)
 Patient ID: Melissa [name], female   DOB: 11/23/1955, 68 y.o.   MRN: [id number]  Follow up: Eustachian tube dysfunction, hearing loss  HPI: The patient is a 68 year old female who returns today for her follow-up evaluation.  The patient was previously seen for eustachian tube dysfunction and hearing loss.  She was treated with Flonase nasal spray.  The patient returns today complaining of occasional ear discomfort and tinnitus.  She denies any recent change in her hearing.  At her last visit in September 2024, she was noted to have mild bilateral high-frequency sensorineural hearing loss.  Currently she denies any otorrhea or vertigo.  Exam: General: Communicates without difficulty, well nourished, no acute distress. Head: Normocephalic, no evidence injury, no tenderness, facial buttresses intact without stepoff. Face/sinus: No tenderness to palpation and percussion. Facial movement is normal and symmetric. Eyes: PERRL, EOMI. No scleral icterus, conjunctivae clear. Neuro: CN II exam reveals vision grossly intact.  No nystagmus at any point of gaze. Ears: Auricles well formed without lesions.  Bilateral cerumen impaction.  Nose: External evaluation reveals normal support and skin without lesions.  Dorsum is intact.  Anterior rhinoscopy reveals congested mucosa over anterior aspect of inferior turbinates and intact septum.  No purulence noted. Oral:  Oral cavity and oropharynx are intact, symmetric, without erythema or edema.  Mucosa is moist without lesions. Neck: Full range of motion without pain.  There is no significant lymphadenopathy.  No masses palpable.  Thyroid  bed within normal limits to palpation.  Parotid glands and submandibular glands equal bilaterally without mass.  Trachea is midline. Neuro:  CN 2-12 grossly intact.   Procedure: Bilateral cerumen disimpaction Anesthesia: None Description: Under the operating microscope, the cerumen is carefully removed with a combination of cerumen  currette, alligator forceps, and suction catheters.  After the cerumen is removed, the TMs are noted to be normal.  No mass, erythema, or lesions. The patient tolerated the procedure well.   Assessment: 1.  Bilateral cerumen impaction.  After the disimpaction procedure, both tympanic membranes and middle ear spaces are noted to be normal. 2.  Clinical eustachian tube dysfunction. 3.  Chronic rhinitis with nasal mucosal congestion.  Plan: 1.  Otomicroscopy with bilateral cerumen disimpaction. 2.  The physical exam findings are reviewed with the patient. 3.  Flonase nasal spray 2 sprays each nostril daily. 4.  Valsalva exercise multiple times a day. 5.  The patient will return for reevaluation in 1 year, sooner if needed.

## 2023-06-22 DIAGNOSIS — H903 Sensorineural hearing loss, bilateral: Secondary | ICD-10-CM | POA: Insufficient documentation

## 2023-06-22 DIAGNOSIS — H6983 Other specified disorders of Eustachian tube, bilateral: Secondary | ICD-10-CM | POA: Insufficient documentation

## 2023-06-22 DIAGNOSIS — H9313 Tinnitus, bilateral: Secondary | ICD-10-CM | POA: Insufficient documentation

## 2023-06-22 DIAGNOSIS — H6123 Impacted cerumen, bilateral: Secondary | ICD-10-CM | POA: Insufficient documentation

## 2023-06-24 ENCOUNTER — Ambulatory Visit: Admitting: Psychology

## 2023-06-24 DIAGNOSIS — F411 Generalized anxiety disorder: Secondary | ICD-10-CM

## 2023-06-24 DIAGNOSIS — F4323 Adjustment disorder with mixed anxiety and depressed mood: Secondary | ICD-10-CM

## 2023-06-24 NOTE — Progress Notes (Addendum)
 PROGRESS NOTE:  Name: Melissa [name] Date: 06/24/2023 MRN: [id number] DOB: 1956/01/12 PCP: Faustina Hood, MD  Time spent: 9:00 AM- 9:58 AM   Today I met in office with  Melissa [name] for in-person face-to-face individual psychotherapy.   Reason for Visit /Presenting Problem:  Melissa [name] is a 68 year old MWF who was referred by her PCP.  At Christmas there was an event involving her daughter that precipitated a family "blow up."  They've only seen one another once since then.  Melissa [name] has been finding herself getting irritable and "short" with her husband and she thinks it's because she is so upset about the situation with her daughter.  Melissa [name] is struggling with what to do and is fearful that she will never be able to have a relationship with her daughter.     Mental Status Exam: Appearance:   Casual and Fairly Groomed     Behavior:  Appropriate  Motor:  Normal  Speech/Language:   NA  Affect:  Appropriate  Mood:  anxious, depressed, and irritable  Thought process:  normal  Thought content:    WNL  Sensory/Perceptual disturbances:    WNL  Orientation:  oriented to person, place, time/date, and situation  Attention:  Good  Concentration:  Good  Memory:  Fair  Fund of knowledge:   Good  Insight:    Good  Judgment:   Good  Impulse Control:  Good     Individualized Treatment Plan       Strengths: kind, helpful, cheerful, resourceful  Supports: Husband, son and friends   Goal/Needs for Treatment:  In order of importance to patient 1) Work on repairing relationship with daughter through the use of better communication and conflict management  skills, and a greater understanding of their family dynamics.  2) Learn and implement skills and strategies to better manage depression.  3) Learn and implement skills and strategies to better manage anxiety  Goal 4) Grow in self understanding and become more resilient/self possessed in the face of criticism and  other negative interactions   Client Statement of Needs: she would like to focus on working through an upsetting family problem, would like a deeper understanding of her reactions to criticism and how she perceives things negatively, wants to feel less anxious and depressed   Treatment Level: Outpatient Individual Psychotherapy  Symptoms:   Feels nervous, worries about different things, becomes irritable, low mood states, feels hopeless, loss of interest, sleep disruption (continuous), poor concentration, feels bad about herself  Client Treatment Preferences: would prefer in person but is also open to virtual appointments   Healthcare consumer's goal for treatment:  Psychologist, Elder Greening, [name] will support the patient's ability to achieve the goals identified. Cognitive Behavioral Therapy, Dialectical Behavioral Therapy, Motivational Interviewing, Behavior Activation and other evidenced-based practices will be used to promote progress towards healthy functioning.   Healthcare consumer Melissa [name] will: Actively participate in therapy, working towards healthy functioning.    *Justification for Continuation/Discontinuation of Goal: R=Revised, O=Ongoing, A=Achieved, D=Discontinued  Goal 1) Work on repairing relationship with daughter through the use of better communication and  conflict management skills, and a greater understanding of their family dynamics.  Likert rating baseline date: 03/17/2022 Target Date Goal Was reviewed Status Code Progress towards goal/Likert rating    07/16/2023            O               Goal 2)  Learn and implement  skills and strategies to better manage depression.   Likert rating baseline date: 03/17/2022 Target Date Goal Was reviewed Status Code Progress towards goal/Likert rating   07/16/2023           O              Goal 3) Learn and implement skills and strategies to better manage anxiety    Likert rating baseline date: 03/17/2022 Target Date  Goal Was reviewed Status Code Progress towards goal/Likert rating   07/16/2023           O              Goal 4) Grow in self understanding and ability to self regulate during negative interactions   Likert rating baseline date: 03/17/2022 Target Date Goal Was reviewed Status Code Progress towards goal/Likert rating   07/16/2023           O              This plan has been reviewed and created by the following participants:  This plan will be reviewed at least every 12 months. Date Behavioral Health Clinician Date Guardian/Patient   03/17/2022 Elder Greening, [name]  03/17/2022 Melissa [name]                  Diagnosis:  Adjustment Disorder with mixed anxiety and depressed mood Generalized Anxiety Disorder   Note: Pt will be out of town most of the summer and we decided it would be best to focus on her preparations to be away and complete her  annual review when she returns.  Melissa [name] reports that she finished her french class and had a nice conversation with her instructor who had some suggestions for how to continue to practice her [place].  We d/ what she'd like to do next related to her [place] lessons.  Melissa [name] has a long stretch of time that she and her husband will be traveling together over the summer months.  We d/e/p her concerns, anxieties, /needs/desires and how to balance her needs with those of her husband's and not "cave" to past patterns of behavior.  I was able to offer praise for the times she didn't "cave" and had in fact practiced new behaviors with success, and together we identified a few situations where she might assertive herself.  Home Practice: journal on prompts provided in session   Elder Greening, PhD

## 2023-07-05 DIAGNOSIS — F419 Anxiety disorder, unspecified: Secondary | ICD-10-CM | POA: Diagnosis not present

## 2023-07-05 DIAGNOSIS — Z0000 Encounter for general adult medical examination without abnormal findings: Secondary | ICD-10-CM | POA: Diagnosis not present

## 2023-07-05 DIAGNOSIS — I10 Essential (primary) hypertension: Secondary | ICD-10-CM | POA: Diagnosis not present

## 2023-07-05 DIAGNOSIS — E7800 Pure hypercholesterolemia, unspecified: Secondary | ICD-10-CM | POA: Diagnosis not present

## 2023-07-05 DIAGNOSIS — Z1211 Encounter for screening for malignant neoplasm of colon: Secondary | ICD-10-CM | POA: Diagnosis not present

## 2023-07-05 DIAGNOSIS — G4700 Insomnia, unspecified: Secondary | ICD-10-CM | POA: Diagnosis not present

## 2023-07-05 DIAGNOSIS — Z13820 Encounter for screening for osteoporosis: Secondary | ICD-10-CM | POA: Diagnosis not present

## 2023-07-05 DIAGNOSIS — Z1331 Encounter for screening for depression: Secondary | ICD-10-CM | POA: Diagnosis not present

## 2023-07-08 ENCOUNTER — Other Ambulatory Visit (HOSPITAL_BASED_OUTPATIENT_CLINIC_OR_DEPARTMENT_OTHER): Payer: Self-pay | Admitting: Family Medicine

## 2023-07-08 DIAGNOSIS — Z13820 Encounter for screening for osteoporosis: Secondary | ICD-10-CM

## 2023-07-12 ENCOUNTER — Other Ambulatory Visit (HOSPITAL_COMMUNITY): Payer: Self-pay

## 2023-07-12 MED ORDER — ESCITALOPRAM OXALATE 10 MG PO TABS
10.0000 mg | ORAL_TABLET | Freq: Every day | ORAL | 1 refills | Status: AC
  Filled 2023-07-12: qty 30, 30d supply, fill #0
  Filled 2023-07-12: qty 90, 90d supply, fill #0

## 2023-07-12 MED ORDER — LOSARTAN POTASSIUM 25 MG PO TABS
25.0000 mg | ORAL_TABLET | Freq: Every day | ORAL | 1 refills | Status: DC
  Filled 2023-07-12: qty 90, 90d supply, fill #0
  Filled 2023-10-07: qty 90, 90d supply, fill #1

## 2023-07-15 DIAGNOSIS — Z1211 Encounter for screening for malignant neoplasm of colon: Secondary | ICD-10-CM | POA: Diagnosis not present

## 2023-07-23 ENCOUNTER — Other Ambulatory Visit (HOSPITAL_COMMUNITY): Payer: Self-pay

## 2023-09-14 ENCOUNTER — Other Ambulatory Visit (HOSPITAL_COMMUNITY): Payer: Self-pay

## 2023-09-14 MED ORDER — ESCITALOPRAM OXALATE 10 MG PO TABS
10.0000 mg | ORAL_TABLET | Freq: Every day | ORAL | 1 refills | Status: AC
  Filled 2023-09-14: qty 90, 90d supply, fill #0
  Filled 2023-12-29: qty 90, 90d supply, fill #1

## 2023-09-14 MED ORDER — BUPROPION HCL ER (XL) 150 MG PO TB24
150.0000 mg | ORAL_TABLET | Freq: Every morning | ORAL | 1 refills | Status: DC
  Filled 2023-09-14: qty 90, 90d supply, fill #0
  Filled 2023-12-21: qty 90, 90d supply, fill #1

## 2023-10-13 ENCOUNTER — Ambulatory Visit: Admitting: Psychology

## 2023-10-13 DIAGNOSIS — F411 Generalized anxiety disorder: Secondary | ICD-10-CM

## 2023-10-13 DIAGNOSIS — F4323 Adjustment disorder with mixed anxiety and depressed mood: Secondary | ICD-10-CM

## 2023-10-13 DIAGNOSIS — F330 Major depressive disorder, recurrent, mild: Secondary | ICD-10-CM

## 2023-10-13 NOTE — Progress Notes (Addendum)
 PROGRESS NOTE:  Name: Melissa [name] Date: 10/13/2023 MRN: [id number] DOB: May 15, 1955 PCP: Claudene Pellet, MD  Time spent: 1:00 PM- 1:58 PM   Today I met in office with  Melissa [name] for in-person face-to-face individual psychotherapy.   Reason for Visit /Presenting Problem:  Melissa [name] is a 68 year old MWF who was referred by her PCP.  At Christmas there was an event involving her daughter that precipitated a family blow up.  They've only seen one another once since then.  Melissa [name] has been finding herself getting irritable and short with her husband and she thinks it's because she is so upset about the situation with her daughter.  Melissa [name] is struggling with what to do and is fearful that she will never be able to have a relationship with her daughter.     Mental Status Exam: Appearance:   Casual and Fairly Groomed     Behavior:  Appropriate  Motor:  Normal  Speech/Language:   NA  Affect:  Appropriate  Mood:  anxious, depressed, and irritable  Thought process:  normal  Thought content:    WNL  Sensory/Perceptual disturbances:    WNL  Orientation:  oriented to person, place, time/date, and situation  Attention:  Good  Concentration:  Good  Memory:  Fair  Fund of knowledge:   Good  Insight:    Good  Judgment:   Good  Impulse Control:  Good     Individualized Treatment Plan       Strengths: kind, helpful, cheerful, resourceful  Supports: Husband, son and friends   Goal/Needs for Treatment:  In order of importance to patient 1) Work on repairing relationship with daughter through the use of better communication and conflict management  skills, and a greater understanding of their family dynamics.  2) Learn and implement skills and strategies to better manage depression.  3) Learn and implement skills and strategies to better manage anxiety  Goal 4) Grow in self understanding and become more resilient/self possessed in the face of criticism  and other negative interactions   Client Statement of Needs: she would like to focus on working through an upsetting family problem, would like a deeper understanding of her reactions to criticism and how she perceives things negatively, wants to feel less anxious and depressed   Treatment Level: Outpatient Individual Psychotherapy  Symptoms:   Feels nervous, worries about different things, becomes irritable, low mood states, feels hopeless, loss of interest, sleep disruption (continuous), poor concentration, feels bad about herself  Client Treatment Preferences: would prefer in person but is also open to virtual appointments   Healthcare consumer's goal for treatment:  Psychologist, [name], [name] will support the patient's ability to achieve the goals identified. Cognitive Behavioral Therapy, Dialectical Behavioral Therapy, Motivational Interviewing, Behavior Activation and other evidenced-based practices will be used to promote progress towards healthy functioning.   Healthcare consumer Melissa [name] will: Actively participate in therapy, working towards healthy functioning.    *Justification for Continuation/Discontinuation of Goal: R=Revised, O=Ongoing, A=Achieved, D=Discontinued  Goal 1) Work on repairing relationship with daughter through the use of better communication and  conflict management skills, and a greater understanding of their family dynamics.  Likert rating baseline date: 03/17/2022 Target Date Goal Was reviewed Status Code Progress towards goal/Likert rating    10/16/2023            O               Goal 2)  Learn and implement skills and strategies to better manage depression.   Likert rating baseline date: 03/17/2022 Target Date Goal Was reviewed Status Code Progress towards goal/Likert rating   10/16/2023           O              Goal 3) Learn and implement skills and strategies to better manage anxiety    Likert rating baseline date: 03/17/2022 Target  Date Goal Was reviewed Status Code Progress towards goal/Likert rating   10/16/2023           O              Goal 4) Grow in self understanding and ability to self regulate during negative interactions   Likert rating baseline date: 03/17/2022 Target Date Goal Was reviewed Status Code Progress towards goal/Likert rating   10/16/2023           O              This plan has been reviewed and created by the following participants:  This plan will be reviewed at least every 12 months. Date Behavioral Health Clinician Date Guardian/Patient   03/17/2022 [name], [name]  03/17/2022 Melissa [name] Melissa [name]                  Diagnosis:  Adjustment Disorder with mixed anxiety and depressed mood Generalized Anxiety Disorder    Melissa [name] reports that they had a delightful time away for the summer and traveling with friends.  Prior to leaving a vacation, we d/e/p her concerns, anxieties, /needs/desires and how to balance her needs with those of her husband's and not repeat old patterns of behavior.  Melissa [name] states that for the most part she did a good job of taking some time for herself and did not simply going along with her husband's plans when they did not suit her.  We d/p what it was like to start herself and respect her own needs.  There will be another opportunity for Melissa [name] to create new expectations with her husband when they travel to her husband's annual family reunion.  Melissa [name] seemed excited by the prospect of doing things differently for herself.   Home Practice: journal on prompts provided in session   [name], PhD

## 2023-10-14 ENCOUNTER — Other Ambulatory Visit: Payer: Self-pay | Admitting: Family Medicine

## 2023-10-14 ENCOUNTER — Inpatient Hospital Stay (HOSPITAL_BASED_OUTPATIENT_CLINIC_OR_DEPARTMENT_OTHER): Admission: RE | Admit: 2023-10-14 | Source: Ambulatory Visit

## 2023-10-14 DIAGNOSIS — Z1231 Encounter for screening mammogram for malignant neoplasm of breast: Secondary | ICD-10-CM

## 2023-10-16 DIAGNOSIS — I10 Essential (primary) hypertension: Secondary | ICD-10-CM | POA: Diagnosis not present

## 2023-10-18 ENCOUNTER — Other Ambulatory Visit (HOSPITAL_BASED_OUTPATIENT_CLINIC_OR_DEPARTMENT_OTHER): Payer: Self-pay

## 2023-10-18 MED ORDER — FLUZONE HIGH-DOSE 0.5 ML IM SUSY
0.5000 mL | PREFILLED_SYRINGE | Freq: Once | INTRAMUSCULAR | 0 refills | Status: AC
  Filled 2023-10-18: qty 0.5, 1d supply, fill #0

## 2023-10-18 MED ORDER — COMIRNATY 30 MCG/0.3ML IM SUSY
0.3000 mL | PREFILLED_SYRINGE | Freq: Once | INTRAMUSCULAR | 0 refills | Status: AC
Start: 2023-10-18 — End: 2023-10-19
  Filled 2023-10-18: qty 0.3, 1d supply, fill #0

## 2023-10-21 DIAGNOSIS — I10 Essential (primary) hypertension: Secondary | ICD-10-CM | POA: Diagnosis not present

## 2023-10-21 DIAGNOSIS — R32 Unspecified urinary incontinence: Secondary | ICD-10-CM | POA: Diagnosis not present

## 2023-10-21 DIAGNOSIS — E559 Vitamin D deficiency, unspecified: Secondary | ICD-10-CM | POA: Diagnosis not present

## 2023-10-21 DIAGNOSIS — F324 Major depressive disorder, single episode, in partial remission: Secondary | ICD-10-CM | POA: Diagnosis not present

## 2023-10-21 DIAGNOSIS — F411 Generalized anxiety disorder: Secondary | ICD-10-CM | POA: Diagnosis not present

## 2023-10-21 DIAGNOSIS — E7800 Pure hypercholesterolemia, unspecified: Secondary | ICD-10-CM | POA: Diagnosis not present

## 2023-10-28 ENCOUNTER — Encounter

## 2023-10-28 ENCOUNTER — Ambulatory Visit: Admitting: Psychology

## 2023-10-28 DIAGNOSIS — F411 Generalized anxiety disorder: Secondary | ICD-10-CM | POA: Diagnosis not present

## 2023-10-28 DIAGNOSIS — F330 Major depressive disorder, recurrent, mild: Secondary | ICD-10-CM

## 2023-10-28 DIAGNOSIS — F4323 Adjustment disorder with mixed anxiety and depressed mood: Secondary | ICD-10-CM

## 2023-10-28 DIAGNOSIS — Z1231 Encounter for screening mammogram for malignant neoplasm of breast: Secondary | ICD-10-CM

## 2023-10-28 NOTE — Progress Notes (Addendum)
 PROGRESS NOTE:  Name: Melissa [name] Date: 10/28/2023 MRN: [id number] DOB: 1955-06-05 PCP: Claudene Pellet, MD  Time spent: 1:00 PM- 1:58 PM  Annual Review: 11/15/2023   Today I met in office with  Melissa [name] for in-person face-to-face individual psychotherapy.   Reason for Visit /Presenting Problem:  Melissa [name] is a 68 year old MWF who was referred by her PCP.  At Christmas there was an event involving her daughter that precipitated a family blow up.  They've only seen one another once since then.  Melissa has been finding herself getting irritable and short with her husband and she thinks it's because she is so upset about the situation with her daughter.  Melissa is struggling with what to do and is fearful that she will never be able to have a relationship with her daughter.     Mental Status Exam: Appearance:   Casual and Fairly Groomed     Behavior:  Appropriate  Motor:  Normal  Speech/Language:   NA  Affect:  Appropriate  Mood:  anxious, depressed, and irritable  Thought process:  normal  Thought content:    WNL  Sensory/Perceptual disturbances:    WNL  Orientation:  oriented to person, place, time/date, and situation  Attention:  Good  Concentration:  Good  Memory:  Fair  Fund of knowledge:   Good  Insight:    Good  Judgment:   Good  Impulse Control:  Good     Individualized Treatment Plan       Strengths: kind, helpful, cheerful, resourceful  Supports: Husband, son and friends   Goal/Needs for Treatment:  In order of importance to patient 1) Work on repairing relationship with daughter through the use of better communication and conflict management  skills, and a greater understanding of their family dynamics.  2) Learn and implement skills and strategies to better manage depression.  3) Learn and implement skills and strategies to better manage anxiety  Goal 4) Grow in self understanding and become more resilient/self  possessed in the face of criticism and other negative interactions   Client Statement of Needs: she would like to focus on working through an upsetting family problem, would like a deeper understanding of her reactions to criticism and how she perceives things negatively, wants to feel less anxious and depressed   Treatment Level: Outpatient Individual Psychotherapy  Symptoms:   Feels nervous, worries about different things, becomes irritable, low mood states, feels hopeless, loss of interest, sleep disruption (continuous), poor concentration, feels bad about herself  Client Treatment Preferences: would prefer in person but is also open to virtual appointments   Healthcare consumer's goal for treatment:  Psychologist, [name], [name] will support the patient's ability to achieve the goals identified. Cognitive Behavioral Therapy, Dialectical Behavioral Therapy, Motivational Interviewing, Behavior Activation and other evidenced-based practices will be used to promote progress towards healthy functioning.   Healthcare consumer Melissa [name] will: Actively participate in therapy, working towards healthy functioning.    *Justification for Continuation/Discontinuation of Goal: R=Revised, O=Ongoing, A=Achieved, D=Discontinued  Goal 1) Work on repairing relationship with daughter through the use of better communication and  conflict management skills, and a greater understanding of their family dynamics.  Likert rating baseline date: 03/17/2022 Target Date Goal Was reviewed Status Code Progress towards goal/Likert rating    11/15/2023            O  Goal 2)  Learn and implement skills and strategies to better manage depression.   Likert rating baseline date: 03/17/2022 Target Date Goal Was reviewed Status Code Progress towards goal/Likert rating   11/15/2023           O              Goal 3) Learn and implement skills and strategies to better manage anxiety    Likert  rating baseline date: 03/17/2022 Target Date Goal Was reviewed Status Code Progress towards goal/Likert rating   11/15/2023           O              Goal 4) Grow in self understanding and ability to self regulate during negative interactions   Likert rating baseline date: 03/17/2022 Target Date Goal Was reviewed Status Code Progress towards goal/Likert rating   11/15/2023           O              This plan has been reviewed and created by the following participants:  This plan will be reviewed at least every 12 months. Date Behavioral Health Clinician Date Guardian/Patient   03/17/2022 [name], [name]  03/17/2022 Melissa [name]                  Diagnosis:  Adjustment Disorder with mixed anxiety and depressed mood Generalized Anxiety Disorder   Melissa came into the session reporting that she was proud of herself for asking for what she needed.  It opened a broader d/e about an ongoing issues that is impact by hers and her husband's different temperaments and needs.  We d/e/p what was at the root of her concern, feeling pressured to go along with what he wants, and having the confidence to stick to her decisions).  Melissa agreed to have a f/u discussion with her husband.  Lastly, now that we are back on track after Melissa [name]'s summer long break, we agreed to conduct her annual review during her next appointment.   Home Practice: journal on prompts provided in session   [name], PhD

## 2023-11-02 ENCOUNTER — Ambulatory Visit
Admission: RE | Admit: 2023-11-02 | Discharge: 2023-11-02 | Disposition: A | Discharge status: Home / Self Care | Source: Ambulatory Visit | Attending: Family Medicine | Admitting: Family Medicine

## 2023-11-02 DIAGNOSIS — E7800 Pure hypercholesterolemia, unspecified: Secondary | ICD-10-CM | POA: Diagnosis not present

## 2023-11-02 DIAGNOSIS — F320 Major depressive disorder, single episode, mild: Secondary | ICD-10-CM | POA: Diagnosis not present

## 2023-11-02 DIAGNOSIS — Z1231 Encounter for screening mammogram for malignant neoplasm of breast: Secondary | ICD-10-CM | POA: Diagnosis not present

## 2023-11-02 DIAGNOSIS — I10 Essential (primary) hypertension: Secondary | ICD-10-CM | POA: Diagnosis not present

## 2023-11-05 ENCOUNTER — Other Ambulatory Visit: Payer: Self-pay | Admitting: Family Medicine

## 2023-11-05 DIAGNOSIS — R928 Other abnormal and inconclusive findings on diagnostic imaging of breast: Secondary | ICD-10-CM

## 2023-11-14 DIAGNOSIS — I10 Essential (primary) hypertension: Secondary | ICD-10-CM | POA: Diagnosis not present

## 2023-11-16 ENCOUNTER — Ambulatory Visit
Admission: RE | Admit: 2023-11-16 | Discharge: 2023-11-16 | Disposition: A | Discharge status: Home / Self Care | Source: Ambulatory Visit | Attending: Family Medicine | Admitting: Family Medicine

## 2023-11-16 DIAGNOSIS — R928 Other abnormal and inconclusive findings on diagnostic imaging of breast: Secondary | ICD-10-CM | POA: Diagnosis not present

## 2023-11-18 ENCOUNTER — Ambulatory Visit: Admitting: Psychology

## 2023-11-18 DIAGNOSIS — F4323 Adjustment disorder with mixed anxiety and depressed mood: Secondary | ICD-10-CM

## 2023-11-18 DIAGNOSIS — F411 Generalized anxiety disorder: Secondary | ICD-10-CM

## 2023-11-18 DIAGNOSIS — F330 Major depressive disorder, recurrent, mild: Secondary | ICD-10-CM

## 2023-11-18 NOTE — Progress Notes (Addendum)
 PROGRESS NOTE:  Name: Melissa [name] Date: 11/18/2023 MRN: [id number] DOB: 05-Sep-1955 PCP: Claudene Pellet, MD  Time spent: 1:00 PM- 1:58 PM  Annual Review: 11/15/2023   Today I met in office with  Melissa [name] for in-person face-to-face individual psychotherapy.   Reason for Visit /Presenting Problem:  Melissa [name] is a 68 year old MWF who was referred by her PCP.  At Christmas there was an event involving her daughter that precipitated a family blow up.  They've only seen one another once since then.  Melissa has been finding herself getting irritable and short with her husband and she thinks it's because she is so upset about the situation with her daughter.  Melissa is struggling with what to do and is fearful that she will never be able to have a relationship with her daughter.     Mental Status Exam: Appearance:   Casual and Fairly Groomed     Behavior:  Appropriate  Motor:  Normal  Speech/Language:   NA  Affect:  Appropriate  Mood:  anxious, depressed, and irritable  Thought process:  normal  Thought content:    WNL  Sensory/Perceptual disturbances:    WNL  Orientation:  oriented to person, place, time/date, and situation  Attention:  Good  Concentration:  Good  Memory:  Fair  Fund of knowledge:   Good  Insight:    Good  Judgment:   Good  Impulse Control:  Good     Individualized Treatment Plan       Strengths: kind, helpful, cheerful, resourceful  Supports: Husband, son and friends   Goal/Needs for Treatment:  In order of importance to patient 1) Work on repairing relationship with daughter through the use of better communication and conflict management  skills, and a greater understanding of their family dynamics.  2) Learn and implement skills and strategies to better manage depression.  3) Learn and implement skills and strategies to better manage anxiety  Goal 4) Grow in self understanding and become more resilient/self  possessed in the face of criticism and other negative interactions   Client Statement of Needs: she would like to focus on working through an upsetting family problem, would like a deeper understanding of her reactions to criticism and how she perceives things negatively, wants to feel less anxious and depressed   Treatment Level: Outpatient Individual Psychotherapy  Symptoms:   Feels nervous, worries about different things, becomes irritable, low mood states, feels hopeless, loss of interest, sleep disruption (continuous), poor concentration, feels bad about herself  Client Treatment Preferences: would prefer in person but is also open to virtual appointments   Healthcare consumer's goal for treatment:  Psychologist, [name], [name] will support the patient's ability to achieve the goals identified. Cognitive Behavioral Therapy, Dialectical Behavioral Therapy, Motivational Interviewing, Behavior Activation and other evidenced-based practices will be used to promote progress towards healthy functioning.   Healthcare consumer Melissa [name] will: Actively participate in therapy, working towards healthy functioning.    *Justification for Continuation/Discontinuation of Goal: R=Revised, O=Ongoing, A=Achieved, D=Discontinued  Goal 1) Work on repairing relationship with daughter through the use of better communication and  conflict management skills, and a greater understanding of their family dynamics.  Likert rating baseline date: 03/17/2022 Target Date Goal Was reviewed Status Code Progress towards goal/Likert rating    01/25/2024            O  Goal 2)  Learn and implement skills and strategies to better manage depression.   Likert rating baseline date: 03/17/2022 Target Date Goal Was reviewed Status Code Progress towards goal/Likert rating  01/25/2024           O              Goal 3) Learn and implement skills and strategies to better manage anxiety    Likert  rating baseline date: 03/17/2022 Target Date Goal Was reviewed Status Code Progress towards goal/Likert rating  01/25/2024           O              Goal 4) Grow in self understanding and ability to self regulate during negative interactions   Likert rating baseline date: 03/17/2022 Target Date Goal Was reviewed Status Code Progress towards goal/Likert rating  01/25/2024           O              This plan has been reviewed and created by the following participants:  This plan will be reviewed at least every 12 months. Date Behavioral Health Clinician Date Guardian/Patient   03/17/2022 [name], [name]  03/17/2022 Melissa [name]                  Diagnosis:  Adjustment Disorder with mixed anxiety and depressed mood Generalized Anxiety Disorder   Melissa reports that her husband's family reunion went well and that she was glad that she went this year.  We d/e/p that next year will be different and becoming more aware of how they are all aging.  We d/e/p that she got her annual mammogram and she had to go in for re-imaging and a follow up ultrasound.  We d/p her initial anxiety and eventual relief when she was cleared.   Home Practice: journal on prompts provided in session     [name], PhD

## 2023-12-01 ENCOUNTER — Ambulatory Visit (INDEPENDENT_AMBULATORY_CARE_PROVIDER_SITE_OTHER): Admitting: Psychology

## 2023-12-01 DIAGNOSIS — F411 Generalized anxiety disorder: Secondary | ICD-10-CM | POA: Diagnosis not present

## 2023-12-01 DIAGNOSIS — F4323 Adjustment disorder with mixed anxiety and depressed mood: Secondary | ICD-10-CM | POA: Diagnosis not present

## 2023-12-01 DIAGNOSIS — F419 Anxiety disorder, unspecified: Secondary | ICD-10-CM

## 2023-12-01 NOTE — Progress Notes (Addendum)
 PROGRESS NOTE:  Name: Melissa [name] Date: 12/01/2023 MRN: [id number] DOB: Nov 09, 1955 PCP: Claudene Pellet, MD  Time spent: 1:00 PM- 1:58 PM  Annual Review: 11/15/2023   Today I met in office with  Melissa [name] for in-person face-to-face individual psychotherapy.   Reason for Visit /Presenting Problem:  Melissa [name] is a 68 year old MWF who was referred by her PCP.  At Christmas there was an event involving her daughter that precipitated a family blow up.  They've only seen one another once since then.  Melissa has been finding herself getting irritable and short with her husband and she thinks it's because she is so upset about the situation with her daughter.  Melissa is struggling with what to do and is fearful that she will never be able to have a relationship with her daughter.     Mental Status Exam: Appearance:   Casual and Fairly Groomed     Behavior:  Appropriate  Motor:  Normal  Speech/Language:   NA  Affect:  Appropriate  Mood:  anxious, depressed, and irritable  Thought process:  normal  Thought content:    WNL  Sensory/Perceptual disturbances:    WNL  Orientation:  oriented to person, place, time/date, and situation  Attention:  Good  Concentration:  Good  Memory:  Fair  Fund of knowledge:   Good  Insight:    Good  Judgment:   Good  Impulse Control:  Good     Individualized Treatment Plan       Strengths: kind, helpful, cheerful, resourceful  Supports: Husband, son and friends   Goal/Needs for Treatment:  In order of importance to patient 1) Work on repairing relationship with daughter through the use of better communication and conflict management  skills, and a greater understanding of their family dynamics.  2) Learn and implement skills and strategies to better manage depression.  3) Learn and implement skills and strategies to better manage anxiety  Goal 4) Grow in self understanding and become more resilient/self  possessed in the face of criticism and other negative interactions   Client Statement of Needs: she would like to focus on working through an upsetting family problem, would like a deeper understanding of her reactions to criticism and how she perceives things negatively, wants to feel less anxious and depressed   Treatment Level: Outpatient Individual Psychotherapy  Symptoms:   Feels nervous, worries about different things, becomes irritable, low mood states, feels hopeless, loss of interest, sleep disruption (continuous), poor concentration, feels bad about herself  Client Treatment Preferences: would prefer in person but is also open to virtual appointments   Healthcare consumer's goal for treatment:  Psychologist, [name], [name] will support the patient's ability to achieve the goals identified. Cognitive Behavioral Therapy, Dialectical Behavioral Therapy, Motivational Interviewing, Behavior Activation and other evidenced-based practices will be used to promote progress towards healthy functioning.   Healthcare consumer Melissa [name] will: Actively participate in therapy, working towards healthy functioning.    *Justification for Continuation/Discontinuation of Goal: R=Revised, O=Ongoing, A=Achieved, D=Discontinued  Goal 1) Work on repairing relationship with daughter through the use of better communication and  conflict management skills, and a greater understanding of their family dynamics.  Likert rating baseline date: 03/17/2022 Target Date Goal Was reviewed Status Code Progress towards goal/Likert rating    01/15/2024            O  Goal 2)  Learn and implement skills and strategies to better manage depression.   Likert rating baseline date: 03/17/2022 Target Date Goal Was reviewed Status Code Progress towards goal/Likert rating   01/25/2024           O              Goal 3) Learn and implement skills and strategies to better manage anxiety    Likert  rating baseline date: 03/17/2022 Target Date Goal Was reviewed Status Code Progress towards goal/Likert rating   01/25/2024           O              Goal 4) Grow in self understanding and ability to self regulate during negative interactions   Likert rating baseline date: 03/17/2022 Target Date Goal Was reviewed Status Code Progress towards goal/Likert rating   01/25/2024           O              This plan has been reviewed and created by the following participants:  This plan will be reviewed at least every 12 months. Date Behavioral Health Clinician Date Guardian/Patient   03/17/2022 [name], [name]  03/17/2022 Melissa [name]                  Diagnosis:  Adjustment Disorder with mixed anxiety and depressed mood Generalized Anxiety Disorder   Melissa reports that her husband went [place] in [place] with some friends.  She enjoyed having time to herself, but is still expecting too much from herself.  We d/e/p feeling pressured by her list, and needing to find a better balance.  I offered a few suggestions to help with managing the pressure.  Melissa states that she went to dinner with a few couple's they've traveled with while [place] was out of town.  We d/e/p enjoying listening to what interests others, noting this is easier when Velinda is not around, and comparing herself to others in an unfavorable manner.  I normalized the tendency to compare ourselves to others, and I noted that she wasn't giving herself credit or was forgetting all the things that she does (ie., french, sewing, genealogy, etc).  We d/e/p why she may judge herself poorly, and we made some connections to the past ( a judgmental mother who never gave her credit for her accomplishments) and the present.  Home Practice: journal on prompts provided in session     [name], PhD

## 2023-12-03 DIAGNOSIS — E7800 Pure hypercholesterolemia, unspecified: Secondary | ICD-10-CM | POA: Diagnosis not present

## 2023-12-03 DIAGNOSIS — F320 Major depressive disorder, single episode, mild: Secondary | ICD-10-CM | POA: Diagnosis not present

## 2023-12-03 DIAGNOSIS — I10 Essential (primary) hypertension: Secondary | ICD-10-CM | POA: Diagnosis not present

## 2023-12-08 ENCOUNTER — Ambulatory Visit (INDEPENDENT_AMBULATORY_CARE_PROVIDER_SITE_OTHER): Admitting: Otolaryngology

## 2023-12-08 ENCOUNTER — Encounter (INDEPENDENT_AMBULATORY_CARE_PROVIDER_SITE_OTHER): Payer: Self-pay | Admitting: Otolaryngology

## 2023-12-08 VITALS — BP 127/85 | HR 68 | Temp 98.0°F | Ht 63.0 in | Wt 125.0 lb

## 2023-12-08 DIAGNOSIS — H903 Sensorineural hearing loss, bilateral: Secondary | ICD-10-CM

## 2023-12-08 DIAGNOSIS — J310 Chronic rhinitis: Secondary | ICD-10-CM

## 2023-12-08 DIAGNOSIS — H6983 Other specified disorders of Eustachian tube, bilateral: Secondary | ICD-10-CM

## 2023-12-08 DIAGNOSIS — H6123 Impacted cerumen, bilateral: Secondary | ICD-10-CM

## 2023-12-08 NOTE — Progress Notes (Signed)
 Patient ID: Melissa [name], female   DOB: 1955-08-26, 68 y.o.   MRN: [id number]  Follow up: Nasal congestion, eustachian tube dysfunction, hearing loss  HPI: The patient is a 68 year old female who returns today for her follow-up evaluation.  The patient has a history of chronic rhinitis, eustachian tube dysfunction, and hearing loss.  She was treated with Flonase nasal spray.  The patient returns today reporting improvement in her nasal congestion and eustachian tube dysfunction.  She denies any recent change in her hearing.  She also has a history of recurrent cerumen impaction.  Exam: General: Communicates without difficulty, well nourished, no acute distress. Head: Normocephalic, no evidence injury, no tenderness, facial buttresses intact without stepoff. Face/sinus: No tenderness to palpation and percussion. Facial movement is normal and symmetric. Eyes: PERRL, EOMI. No scleral icterus, conjunctivae clear. Neuro: CN II exam reveals vision grossly intact.  No nystagmus at any point of gaze. Ears: Auricles well formed without lesions.  Bilateral cerumen impaction.  Nose: External evaluation reveals normal support and skin without lesions.  Dorsum is intact.  Anterior rhinoscopy reveals congested mucosa over anterior aspect of inferior turbinates and intact septum.  No purulence noted. Oral:  Oral cavity and oropharynx are intact, symmetric, without erythema or edema.  Mucosa is moist without lesions. Neck: Full range of motion without pain.  There is no significant lymphadenopathy.  No masses palpable.  Thyroid  bed within normal limits to palpation.  Parotid glands and submandibular glands equal bilaterally without mass.  Trachea is midline. Neuro:  CN 2-12 grossly intact.   Procedure: Bilateral cerumen disimpaction Anesthesia: None Description: Under the operating microscope, the cerumen is carefully removed with a combination of cerumen currette, alligator forceps, and suction catheters.  After  the cerumen is removed, the TMs are noted to be normal.  No mass, erythema, or lesions. The patient tolerated the procedure well.   Assessment: 1.  Bilateral cerumen impaction.  After the disimpaction procedure, both tympanic membranes and middle ear spaces are noted to be normal. 2.  Clinically improved eustachian tube dysfunction. 3.  Chronic rhinitis with nasal mucosal congestion.  Plan: 1.  Otomicroscopy with bilateral cerumen disimpaction. 2.  The physical exam findings are reviewed with the patient. 3.  Continue Flonase nasal spray 2 sprays each nostril daily. 4.  The patient will return for reevaluation in 6 months, sooner if needed.

## 2023-12-14 DIAGNOSIS — I10 Essential (primary) hypertension: Secondary | ICD-10-CM | POA: Diagnosis not present

## 2023-12-18 ENCOUNTER — Ambulatory Visit (HOSPITAL_BASED_OUTPATIENT_CLINIC_OR_DEPARTMENT_OTHER)
Admission: RE | Admit: 2023-12-18 | Discharge: 2023-12-18 | Disposition: A | Discharge status: Home / Self Care | Source: Ambulatory Visit | Attending: Family Medicine | Admitting: Family Medicine

## 2023-12-18 DIAGNOSIS — Z13820 Encounter for screening for osteoporosis: Secondary | ICD-10-CM | POA: Insufficient documentation

## 2023-12-18 DIAGNOSIS — Z780 Asymptomatic menopausal state: Secondary | ICD-10-CM | POA: Insufficient documentation

## 2023-12-18 DIAGNOSIS — M8580 Other specified disorders of bone density and structure, unspecified site: Secondary | ICD-10-CM | POA: Insufficient documentation

## 2023-12-21 ENCOUNTER — Other Ambulatory Visit (HOSPITAL_COMMUNITY): Payer: Self-pay

## 2024-01-01 ENCOUNTER — Other Ambulatory Visit (HOSPITAL_COMMUNITY): Payer: Self-pay

## 2024-01-02 DIAGNOSIS — F320 Major depressive disorder, single episode, mild: Secondary | ICD-10-CM | POA: Diagnosis not present

## 2024-01-02 DIAGNOSIS — E7800 Pure hypercholesterolemia, unspecified: Secondary | ICD-10-CM | POA: Diagnosis not present

## 2024-01-02 DIAGNOSIS — I10 Essential (primary) hypertension: Secondary | ICD-10-CM | POA: Diagnosis not present

## 2024-01-04 ENCOUNTER — Ambulatory Visit: Admitting: Psychology

## 2024-01-04 ENCOUNTER — Other Ambulatory Visit (HOSPITAL_COMMUNITY): Payer: Self-pay

## 2024-01-04 ENCOUNTER — Other Ambulatory Visit: Payer: Self-pay

## 2024-01-04 DIAGNOSIS — E7800 Pure hypercholesterolemia, unspecified: Secondary | ICD-10-CM | POA: Diagnosis not present

## 2024-01-04 DIAGNOSIS — F419 Anxiety disorder, unspecified: Secondary | ICD-10-CM

## 2024-01-04 DIAGNOSIS — I10 Essential (primary) hypertension: Secondary | ICD-10-CM | POA: Diagnosis not present

## 2024-01-04 MED ORDER — BUPROPION HCL ER (XL) 150 MG PO TB24
150.0000 mg | ORAL_TABLET | Freq: Every day | ORAL | 1 refills | Status: AC
  Filled 2024-01-04 – 2024-02-28 (×2): qty 90, 90d supply, fill #0

## 2024-01-04 MED ORDER — ESCITALOPRAM OXALATE 10 MG PO TABS
15.0000 mg | ORAL_TABLET | Freq: Every day | ORAL | 1 refills | Status: AC
  Filled 2024-01-04 – 2024-03-01 (×3): qty 135, 90d supply, fill #0
  Filled ????-??-??: fill #0

## 2024-01-04 MED ORDER — LOSARTAN POTASSIUM 25 MG PO TABS
25.0000 mg | ORAL_TABLET | Freq: Every day | ORAL | 1 refills | Status: AC
  Filled 2024-01-04: qty 90, 90d supply, fill #0

## 2024-01-04 NOTE — Progress Notes (Signed)
 PROGRESS NOTE:  Name: Melissa [name] Date: 01/04/2024 MRN: [id number] DOB: [birth year] PCP: Claudene Pellet, MD  Time spent: 12:00 PM- 12:58 PM  Annual Review: 01/25/2024   Today I met with  [name] in remote video ([facility]) face-to-face individual psychotherapy.  Distance Site: Client's Home Orginating Site: Dr Edison Remote Office Consent: Obtained verbal consent to transmit  session remotely   Reason for Visit /Presenting Problem:  Melissa [name] is a 68 year old MWF who was referred by her PCP.  At Christmas there was an event involving her daughter that precipitated a family blow up.  They've only seen one another once since then.  Melissa [name] has been finding herself getting irritable and short with her husband and she thinks it's because she is so upset about the situation with her daughter.  Melissa [name] is struggling with what to do and is fearful that she will never be able to have a relationship with her daughter.     Mental Status Exam: Appearance:   Casual and Fairly Groomed     Behavior:  Appropriate  Motor:  Normal  Speech/Language:   NA  Affect:  Appropriate  Mood:  anxious, depressed, and irritable  Thought process:  normal  Thought content:    WNL  Sensory/Perceptual disturbances:    WNL  Orientation:  oriented to person, place, time/date, and situation  Attention:  Good  Concentration:  Good  Memory:  Fair  Fund of knowledge:   Good  Insight:    Good  Judgment:   Good  Impulse Control:  Good     Individualized Treatment Plan       Strengths: kind, helpful, cheerful, resourceful  Supports: Husband, son and friends   Goal/Needs for Treatment:  In order of importance to patient 1) Work on repairing relationship with daughter through the use of better communication and conflict management  skills, and a greater understanding of their family dynamics.  2) Learn and implement skills and strategies to better manage  depression.  3) Learn and implement skills and strategies to better manage anxiety  Goal 4) Grow in self understanding and become more resilient/self possessed in the face of criticism and other negative interactions   Client Statement of Needs: she would like to focus on working through an upsetting family problem, would like a deeper understanding of her reactions to criticism and how she perceives things negatively, wants to feel less anxious and depressed   Treatment Level: Outpatient Individual Psychotherapy  Symptoms:   Feels nervous, worries about different things, becomes irritable, low mood states, feels hopeless, loss of interest, sleep disruption (continuous), poor concentration, feels bad about herself  Client Treatment Preferences: would prefer in person but is also open to virtual appointments   Healthcare consumer's goal for treatment:  Psychologist, [name], [name] will support the patient's ability to achieve the goals identified. Cognitive Behavioral Therapy, Dialectical Behavioral Therapy, Motivational Interviewing, Behavior Activation and other evidenced-based practices will be used to promote progress towards healthy functioning.   Healthcare consumer [place] Behlke will: Actively participate in therapy, working towards healthy functioning.    *Justification for Continuation/Discontinuation of Goal: R=Revised, O=Ongoing, A=Achieved, D=Discontinued  Goal 1) Work on repairing relationship with daughter through the use of better communication and  conflict management skills, and a greater understanding of their family dynamics.  Likert rating baseline date: 03/17/2022 Target Date Goal Was reviewed Status Code Progress towards goal/Likert rating    01/15/2024  O               Goal 2)  Learn and implement skills and strategies to better manage depression.   Likert rating baseline date: 03/17/2022 Target Date Goal Was reviewed Status Code Progress  towards goal/Likert rating   01/25/2024           O              Goal 3) Learn and implement skills and strategies to better manage anxiety    Likert rating baseline date: 03/17/2022 Target Date Goal Was reviewed Status Code Progress towards goal/Likert rating   01/25/2024           O              Goal 4) Grow in self understanding and ability to self regulate during negative interactions   Likert rating baseline date: 03/17/2022 Target Date Goal Was reviewed Status Code Progress towards goal/Likert rating   01/25/2024           O              This plan has been reviewed and created by the following participants:  This plan will be reviewed at least every 12 months. Date Behavioral Health Clinician Date Guardian/Patient   03/17/2022 [name], [name]  03/17/2022 Melissa [name] Ester                  Diagnosis:  Adjustment Disorder with mixed anxiety and depressed mood Generalized Anxiety Disorder   Melissa [name] reports that she had a nice visit with her daughter's family for her granddaughter's Senior Night.  We d/e/p how things went with her daughter, anticipating Christmas, and better managing her anxiety.  I encouraged Melissa [name] to participate in the Christmas holiday, to bring in a tradition she shared with her children growing up and to share it with her grandchildren.  She seems more eager about approaching the holiday in a different manner instead of leaning into avoidance.  Home Practice: journal on prompts provided in session     [name], PhD

## 2024-01-20 ENCOUNTER — Ambulatory Visit: Admitting: Psychology

## 2024-01-20 DIAGNOSIS — F330 Major depressive disorder, recurrent, mild: Secondary | ICD-10-CM

## 2024-01-20 DIAGNOSIS — F419 Anxiety disorder, unspecified: Secondary | ICD-10-CM | POA: Diagnosis not present

## 2024-01-20 DIAGNOSIS — Z600 Problems of adjustment to life-cycle transitions: Secondary | ICD-10-CM | POA: Diagnosis not present

## 2024-01-20 NOTE — Progress Notes (Signed)
 PROGRESS NOTE:  ANNUAL REVIEW  Name: Melissa [name] Date: 01/20/2024 MRN: [id number] DOB: [birth year] PCP: Claudene Pellet, MD  Time spent: 12:00 PM- 12:58 PM   Annual Review: 01/19/2025   Today I met in person with Melissa [name]  for in office face-to-face individual psychotherapy.   Reason for Visit /Presenting Problem:  Melissa [name] is a 68 year old MWF who was referred by her PCP.  Lastt Christmas there was an event involving her daughter that precipitated a family blow up which resulted in an uncomfortable distancing from one another.  Melissa [name] found herself getting irritable and short with her husband and she thought it was a result of the situation with her daughter.  Melissa [name] continues to struggles with what to do and is fearful that she will never be able to have a relationship with her daughter.   Over the past year, she has also voiced some c/o about her husband only wants to do things with her and is needing to have some more independence from him.  In general, Melissa [name] is trying to find out who she is at this stage in her life and to find some joy in her life.  Background History:  In the Spring, Melissa [name] will have been married for 49 years to Melissa [name]. Together they are planning a special celebration for their 50 anniversary.  They have two children together who are nine years apart.  They were very close when they were young but have drifted apart some as they grew older.   Recently, her son Melissa [name] broke off his engagement and she has worried a great deal about his wellbeing.   Mental Status Exam: Appearance:   Casual and Fairly Groomed     Behavior:  Appropriate  Motor:  Normal  Speech/Language:   NA  Affect:  Appropriate  Mood:  anxious, depressed, and irritable  Thought process:  normal  Thought content:    WNL  Sensory/Perceptual disturbances:    WNL  Orientation:  oriented to person, place, time/date, and situation  Attention:  Good   Concentration:  Good  Memory:  Fair  Fund of knowledge:   Good  Insight:    Good  Judgment:   Good  Impulse Control:  Good    Behavioral Health Treatment Plan   Treatment Plan Development Date: 01/20/2024   Strengths: Supportive Relationships, Family, Friends, Charity Fundraiser, Hopefulness, Journalist, Newspaper, and Able to [employer]  Supports: Spouse, Friends, and Sales Promotion Account Executive of Needs: she would like to focus on working through an upsetting family problem, would like a deeper understanding of her reactions to criticism and how she perceives things negatively, wants to feel less anxious and depressed   Treatment Level: Individual Outpatient Psychotherapy  Client Treatment Preferences: to continue with current therapist   Diagnosis Depressive Disorders  Major Depressive Disorder, recurrent, mild  Symptoms:  Depressed mood-indicated by subjective report or observation by others (in children and adolescents, can be irritable mood)., Loss of interest or pleasure in almost all activities-indicated by subjective report or observation by others., Sleep disturbance (insomnia or hypersomnia)., Tiredness, fatigue, or low energy, or decreased efficiency with which routine tasks are completed., A sense of worthlessness or excessive, inappropriate, or delusional guilt (not merely self-reproach or guilt about being sick)., and The symptoms cause clinically significant distress or impairment in social, occupational, or other important areas of functioning.  Goals:  Alleviate depressive symptoms to return to effective functioning., Recognize, accept, and  cope with depressed feelings., and Develop healthy thinking and beliefs about self, others, and the world to alleviate and prevent relapse.  Objectives: Target Date For All Objectives: 01/19/2025  Cooperate with a medication evaluation by physician and follow recommendations., Identify and replace thoughts and  beliefs that support depression., Learn and implement strategies to overcome depression., Identify how people in your life impacted your mood., Explore interpersonal problems and how to resolve to assist in improving mood., Learn and implement problem-solving., Learn and implement conflict resolution skills., Learn and implement decision-making skills., and Learn and implement relapse prevention skills.  Progress Documentation:  Progressing  Interventions:  Cognitive Behavioral Therapy, Dialectical Behavioral Therapy, Assertiveness/Communication, Psychologist, Occupational, Roleplay, Mindfulness Meditation, Motivational Interviewing, Solution-Oriented/Positive Psychology, Humanistic/Existential, Narrative, Environmental Manager, Psycho-education/Bibliotherapy, Insight-Oriented, Object Relations, Family Systems, and Interpersonal  Diagnosis: Anxiety  Generalized Anxiety  Symptoms:  Excessive and/or unrealistic worry that is difficult to control occurring more days than not for at least 6 months about a number of events or activities, Difficulty managing worry, Restlessness or feeling keyed up or on edge, Being easily fatigued, Difficulty concentrating or mind going blank, Irritability, Muscle Tension, and Sleep disturbance (Difficulty falling or staying asleep, restlessness, or unsatisfying sleep  Goals:  Improve daily functioning by reducing overall anxiety symptoms including intensity, frequency, and duration of symptoms., Resolve issues and concerns that are resulting in anxiety., Build and employ tools and skills to reduce symptoms of anxiety, worry, and improve functioning day-to-day., and Address negative cognitions, distortions, and behaviors that contribute to anxiety symptoms and affect overall functioning.  Objectives: Target Date For All Objectives: 01/19/2025  Develop coping tools to manage anxiety including mindfulness, acceptance, relaxation, reframing, and challenging negative thoughts and  feelings., Identify, challenge, and manage negative self-talk, negative thinking about self and others, and maintain mindfulness regarding self-talk and cognitive distortions., Develop consistent self-care routine including exercise, healthful eating, consistent sleep., Identify contributing factors to current anxiety including family of origin issues, past trauma, significant stressors, and negative cognitions., Identify contributing factors to anxiety including previous or current situations., and Maintain mindfulness of symptoms and develop protocol to address symptoms to prevent relapse.  Progress Documentation:  Progressing  Interventions:  Psychoeducation regarding diagnoses and treatment, Motivational Interviewing, CBT - reframing, challenging, cognitive restructuring, DBT, Behavioral Activation, Relaxation and mindfulness, Distress Tolerance, and Communication skills - conflict resolution   Expected duration of treatment: 01/19/2025  This plan has been reviewed and created by the following participants: Melissa [name] and Melissa [name], [name]  A new plan will be created at least every 12 months.   The patient Melissa [name] fully participated in the development of treatment plan with the clinician and verbally consents to such treatment.   Patient Treatment Plan Signature Obtained: Yes, please see patient chart for sign off.   Diagnosis:  Adjustment Disorder with mixed anxiety and depressed mood Generalized Anxiety Disorder Phase of Life Problem, adult   Home Practice: journal on prompts provided in session     Melissa Jenkins Sprung, PhD

## 2024-02-10 ENCOUNTER — Ambulatory Visit: Admitting: Psychology

## 2024-02-10 DIAGNOSIS — F4323 Adjustment disorder with mixed anxiety and depressed mood: Secondary | ICD-10-CM | POA: Diagnosis not present

## 2024-02-10 DIAGNOSIS — F419 Anxiety disorder, unspecified: Secondary | ICD-10-CM | POA: Diagnosis not present

## 2024-02-10 DIAGNOSIS — Z600 Problems of adjustment to life-cycle transitions: Secondary | ICD-10-CM

## 2024-02-10 NOTE — Progress Notes (Signed)
 "      PROGRESS NOTE:   Name: Melissa [name] Date: 02/10/2024 MRN: [id number] DOB: 1955-10-26 PCP: Claudene Pellet, MD  Time spent: 12:00 PM- 12:58 PM   Annual Review: 01/19/2025   Today I met in person with Melissa [name]  for in office face-to-face individual psychotherapy.   Reason for Visit /Presenting Problem:  Melissa [name] is a 69 year old MWF who was referred by her PCP.  Lastt Christmas there was an event involving her daughter that precipitated a family blow up which resulted in an uncomfortable distancing from one another.  Melissa [name] found herself getting irritable and short with her husband and she thought it was a result of the situation with her daughter.  Melissa [name] continues to struggles with what to do and is fearful that she will never be able to have a relationship with her daughter.   Over the past year, she has also voiced some c/o about her husband only wants to do things with her and is needing to have some more independence from him.  In general, Melissa [name] is trying to find out who she is at this stage in her life and to find some joy in her life.  Background History:  In the Spring, Melissa [name] will have been married for 49 years to Melissa [name]. Together they are planning a special celebration for their 50 anniversary.  They have two children together who are nine years apart.  They were very close when they were young but have drifted apart some as they grew older.   Recently, her son Melissa [name] broke off his engagement and she has worried a great deal about his wellbeing.   Mental Status Exam: Appearance:   Casual and Fairly Groomed     Behavior:  Appropriate  Motor:  Normal  Speech/Language:   NA  Affect:  Appropriate  Mood:  anxious, depressed, and irritable  Thought process:  normal  Thought content:    WNL  Sensory/Perceptual disturbances:    WNL  Orientation:  oriented to person, place, time/date, and situation  Attention:  Good  Concentration:  Good   Memory:  Fair  Fund of knowledge:   Good  Insight:    Good  Judgment:   Good  Impulse Control:  Good    Behavioral Health Treatment Plan   Treatment Plan Development Date: 01/20/2024   Strengths: Supportive Relationships, Family, Friends, Charity Fundraiser, Hopefulness, Journalist, Newspaper, and Able to [employer]  Supports: Spouse, Friends, and Sales Promotion Account Executive of Needs: she would like to focus on working through an upsetting family problem, would like a deeper understanding of her reactions to criticism and how she perceives things negatively, wants to feel less anxious and depressed   Treatment Level: Individual Outpatient Psychotherapy  Client Treatment Preferences: to continue with current therapist   Diagnosis Depressive Disorders  Major Depressive Disorder, recurrent, mild  Symptoms:  Depressed mood-indicated by subjective report or observation by others (in children and adolescents, can be irritable mood)., Loss of interest or pleasure in almost all activities-indicated by subjective report or observation by others., Sleep disturbance (insomnia or hypersomnia)., Tiredness, fatigue, or low energy, or decreased efficiency with which routine tasks are completed., A sense of worthlessness or excessive, inappropriate, or delusional guilt (not merely self-reproach or guilt about being sick)., and The symptoms cause clinically significant distress or impairment in social, occupational, or other important areas of functioning.  Goals:  Alleviate depressive symptoms to return to effective functioning., Recognize, accept, and cope  with depressed feelings., and Develop healthy thinking and beliefs about self, others, and the world to alleviate and prevent relapse.  Objectives: Target Date For All Objectives: 01/19/2025  Cooperate with a medication evaluation by physician and follow recommendations., Identify and replace thoughts and beliefs that support  depression., Learn and implement strategies to overcome depression., Identify how people in your life impacted your mood., Explore interpersonal problems and how to resolve to assist in improving mood., Learn and implement problem-solving., Learn and implement conflict resolution skills., Learn and implement decision-making skills., and Learn and implement relapse prevention skills.  Progress Documentation:  Progressing  Interventions:  Cognitive Behavioral Therapy, Dialectical Behavioral Therapy, Assertiveness/Communication, Psychologist, Occupational, Roleplay, Mindfulness Meditation, Motivational Interviewing, Solution-Oriented/Positive Psychology, Humanistic/Existential, Narrative, Environmental Manager, Psycho-education/Bibliotherapy, Insight-Oriented, Object Relations, Family Systems, and Interpersonal  Diagnosis: Anxiety  Generalized Anxiety  Symptoms:  Excessive and/or unrealistic worry that is difficult to control occurring more days than not for at least 6 months about a number of events or activities, Difficulty managing worry, Restlessness or feeling keyed up or on edge, Being easily fatigued, Difficulty concentrating or mind going blank, Irritability, Muscle Tension, and Sleep disturbance (Difficulty falling or staying asleep, restlessness, or unsatisfying sleep  Goals:  Improve daily functioning by reducing overall anxiety symptoms including intensity, frequency, and duration of symptoms., Resolve issues and concerns that are resulting in anxiety., Build and employ tools and skills to reduce symptoms of anxiety, worry, and improve functioning day-to-day., and Address negative cognitions, distortions, and behaviors that contribute to anxiety symptoms and affect overall functioning.  Objectives: Target Date For All Objectives: 01/19/2025  Develop coping tools to manage anxiety including mindfulness, acceptance, relaxation, reframing, and challenging negative thoughts and feelings., Identify,  challenge, and manage negative self-talk, negative thinking about self and others, and maintain mindfulness regarding self-talk and cognitive distortions., Develop consistent self-care routine including exercise, healthful eating, consistent sleep., Identify contributing factors to current anxiety including family of origin issues, past trauma, significant stressors, and negative cognitions., Identify contributing factors to anxiety including previous or current situations., and Maintain mindfulness of symptoms and develop protocol to address symptoms to prevent relapse.  Progress Documentation:  Progressing  Interventions:  Psychoeducation regarding diagnoses and treatment, Motivational Interviewing, CBT - reframing, challenging, cognitive restructuring, DBT, Behavioral Activation, Relaxation and mindfulness, Distress Tolerance, and Communication skills - conflict resolution   Expected duration of treatment: 01/19/2025  This plan has been reviewed and created by the following participants: Melissa [name] and Melissa [name], [name]  A new plan will be created at least every 12 months.   The patient Melissa [name] fully participated in the development of treatment plan with the clinician and verbally consents to such treatment.   Patient Treatment Plan Signature Obtained: Yes, please see patient chart for sign off.   Diagnosis:  Adjustment Disorder with mixed anxiety and depressed mood Generalized Anxiety Disorder Phase of Life Problem, adult   Melissa [name] reports that she had a lovely time over the Christmas holiday.  We d/e/p f/t and baking scones and arranging a gift hunt as she liked and didn't attempt to ask permission. We also d/e/p a couple situations that occurred with her daughter and another situation with her son, how she felt, what she thougth was happening, and not taking her children's personalities personally.     Home Practice: journal on prompts provided in  session     Melissa Jenkins Sprung, PhD    "

## 2024-02-24 ENCOUNTER — Ambulatory Visit: Admitting: Psychology

## 2024-02-24 DIAGNOSIS — F419 Anxiety disorder, unspecified: Secondary | ICD-10-CM

## 2024-02-24 DIAGNOSIS — Z600 Problems of adjustment to life-cycle transitions: Secondary | ICD-10-CM | POA: Diagnosis not present

## 2024-02-24 DIAGNOSIS — F4323 Adjustment disorder with mixed anxiety and depressed mood: Secondary | ICD-10-CM

## 2024-02-24 NOTE — Progress Notes (Signed)
 "      PROGRESS NOTE:   Name: Melissa [name] Date: 02/24/2024 MRN: [id number] DOB: [birth year] PCP: Claudene Pellet, MD  Time spent: 11:00 AM- 11:58 AM   Annual Review: 01/19/2025   Today I met in person with Melissa [name]  for in office face-to-face individual psychotherapy.   Reason for Visit /Presenting Problem:  Melissa [name] is a 69 year old MWF who was referred by her PCP.  Lastt Christmas there was an event involving her daughter that precipitated a family blow up which resulted in an uncomfortable distancing from one another.  Melissa [name] found herself getting irritable and short with her husband and she thought it was a result of the situation with her daughter.  Melissa [name] continues to struggles with what to do and is fearful that she will never be able to have a relationship with her daughter.   Over the past year, she has also voiced some c/o about her husband only wants to do things with her and is needing to have some more independence from him.  In general, Melissa [name] is trying to find out who she is at this stage in her life and to find some joy in her life.  Background History:  In the Spring, Melissa [name] will have been married for 49 years to Melissa [name]. Together they are planning a special celebration for their 50 anniversary.  They have two children together who are nine years apart.  They were very close when they were young but have drifted apart some as they grew older.   Recently, her son Melissa [name] broke off his engagement and she has worried a great deal about his wellbeing.   Mental Status Exam: Appearance:   Casual and Fairly Groomed     Behavior:  Appropriate  Motor:  Normal  Speech/Language:   NA  Affect:  Appropriate  Mood:  anxious, depressed, and irritable  Thought process:  normal  Thought content:    WNL  Sensory/Perceptual disturbances:    WNL  Orientation:  oriented to person, place, time/date, and situation  Attention:  Good  Concentration:  Good   Memory:  Fair  Fund of knowledge:   Good  Insight:    Good  Judgment:   Good  Impulse Control:  Good    Behavioral Health Treatment Plan   Treatment Plan Development Date: 01/20/2024   Strengths: Supportive Relationships, Family, Friends, Charity Fundraiser, Hopefulness, Journalist, Newspaper, and Able to [employer]  Supports: Spouse, Friends, and Sales Promotion Account Executive of Needs: she would like to focus on working through an upsetting family problem, would like a deeper understanding of her reactions to criticism and how she perceives things negatively, wants to feel less anxious and depressed   Treatment Level: Individual Outpatient Psychotherapy  Client Treatment Preferences: to continue with current therapist   Diagnosis Depressive Disorders  Major Depressive Disorder, recurrent, mild  Symptoms:  Depressed mood-indicated by subjective report or observation by others (in children and adolescents, can be irritable mood)., Loss of interest or pleasure in almost all activities-indicated by subjective report or observation by others., Sleep disturbance (insomnia or hypersomnia)., Tiredness, fatigue, or low energy, or decreased efficiency with which routine tasks are completed., A sense of worthlessness or excessive, inappropriate, or delusional guilt (not merely self-reproach or guilt about being sick)., and The symptoms cause clinically significant distress or impairment in social, occupational, or other important areas of functioning.  Goals:  Alleviate depressive symptoms to return to effective functioning., Recognize, accept, and cope  with depressed feelings., and Develop healthy thinking and beliefs about self, others, and the world to alleviate and prevent relapse.  Objectives: Target Date For All Objectives: 01/19/2025  Cooperate with a medication evaluation by physician and follow recommendations., Identify and replace thoughts and beliefs that support  depression., Learn and implement strategies to overcome depression., Identify how people in your life impacted your mood., Explore interpersonal problems and how to resolve to assist in improving mood., Learn and implement problem-solving., Learn and implement conflict resolution skills., Learn and implement decision-making skills., and Learn and implement relapse prevention skills.  Progress Documentation:  Progressing  Interventions:  Cognitive Behavioral Therapy, Dialectical Behavioral Therapy, Assertiveness/Communication, Psychologist, Occupational, Roleplay, Mindfulness Meditation, Motivational Interviewing, Solution-Oriented/Positive Psychology, Humanistic/Existential, Narrative, Environmental Manager, Psycho-education/Bibliotherapy, Insight-Oriented, Object Relations, Family Systems, and Interpersonal  Diagnosis: Anxiety  Generalized Anxiety  Symptoms:  Excessive and/or unrealistic worry that is difficult to control occurring more days than not for at least 6 months about a number of events or activities, Difficulty managing worry, Restlessness or feeling keyed up or on edge, Being easily fatigued, Difficulty concentrating or mind going blank, Irritability, Muscle Tension, and Sleep disturbance (Difficulty falling or staying asleep, restlessness, or unsatisfying sleep  Goals:  Improve daily functioning by reducing overall anxiety symptoms including intensity, frequency, and duration of symptoms., Resolve issues and concerns that are resulting in anxiety., Build and employ tools and skills to reduce symptoms of anxiety, worry, and improve functioning day-to-day., and Address negative cognitions, distortions, and behaviors that contribute to anxiety symptoms and affect overall functioning.  Objectives: Target Date For All Objectives: 01/19/2025  Develop coping tools to manage anxiety including mindfulness, acceptance, relaxation, reframing, and challenging negative thoughts and feelings., Identify,  challenge, and manage negative self-talk, negative thinking about self and others, and maintain mindfulness regarding self-talk and cognitive distortions., Develop consistent self-care routine including exercise, healthful eating, consistent sleep., Identify contributing factors to current anxiety including family of origin issues, past trauma, significant stressors, and negative cognitions., Identify contributing factors to anxiety including previous or current situations., and Maintain mindfulness of symptoms and develop protocol to address symptoms to prevent relapse.  Progress Documentation:  Progressing  Interventions:  Psychoeducation regarding diagnoses and treatment, Motivational Interviewing, CBT - reframing, challenging, cognitive restructuring, DBT, Behavioral Activation, Relaxation and mindfulness, Distress Tolerance, and Communication skills - conflict resolution   Expected duration of treatment: 01/19/2025  This plan has been reviewed and created by the following participants: Melissa [name] and Melissa [name], [name]  A new plan will be created at least every 12 months.   The patient Melissa [name] fully participated in the development of treatment plan with the clinician and verbally consents to such treatment.   Patient Treatment Plan Signature Obtained: Yes, please see patient chart for sign off.   Diagnosis:  Adjustment Disorder with mixed anxiety and depressed mood Generalized Anxiety Disorder Phase of Life Problem, adult   Melissa [name] reports that she attended a friend's wedding shower.  We d/e/p what occurred, how it left her feeling, and reflections about her desire for more and deeper friendships.  We d/p how she might contribute to her own dissatisfaction.  Melissa [name] also states that she struggles with procrastination and how it makes her feel bad about herself.  I normalized her experience, provided psychoeducation about ADHD and procrastination, and  strategies for initiating action and minimizing procrastination.  Home Practice: journal on prompts provided in session     Melissa Jenkins Sprung, PhD    "

## 2024-02-28 ENCOUNTER — Other Ambulatory Visit (HOSPITAL_COMMUNITY): Payer: Self-pay

## 2024-02-29 ENCOUNTER — Other Ambulatory Visit: Payer: Self-pay

## 2024-03-01 ENCOUNTER — Other Ambulatory Visit (HOSPITAL_COMMUNITY): Payer: Self-pay

## 2024-03-16 ENCOUNTER — Ambulatory Visit: Admitting: Psychology

## 2024-04-20 ENCOUNTER — Ambulatory Visit: Admitting: Psychology

## 2024-06-02 ENCOUNTER — Ambulatory Visit (INDEPENDENT_AMBULATORY_CARE_PROVIDER_SITE_OTHER): Admitting: Otolaryngology
